# Patient Record
Sex: Female | Born: 1945
Health system: Southern US, Community
[De-identification: ages and names within clinical notes are randomized; demographics above are authoritative.]

## PROBLEM LIST (undated history)

## (undated) DIAGNOSIS — M5136 Other intervertebral disc degeneration, lumbar region: Secondary | ICD-10-CM

## (undated) DIAGNOSIS — J3089 Other allergic rhinitis: Secondary | ICD-10-CM

## (undated) DIAGNOSIS — H1045 Other chronic allergic conjunctivitis: Secondary | ICD-10-CM

## (undated) DIAGNOSIS — N950 Postmenopausal bleeding: Secondary | ICD-10-CM

## (undated) DIAGNOSIS — F329 Major depressive disorder, single episode, unspecified: Secondary | ICD-10-CM

## (undated) DIAGNOSIS — Z87820 Personal history of traumatic brain injury: Secondary | ICD-10-CM

## (undated) DIAGNOSIS — M51369 Other intervertebral disc degeneration, lumbar region without mention of lumbar back pain or lower extremity pain: Secondary | ICD-10-CM

## (undated) DIAGNOSIS — D250 Submucous leiomyoma of uterus: Secondary | ICD-10-CM

## (undated) DIAGNOSIS — Z8742 Personal history of other diseases of the female genital tract: Secondary | ICD-10-CM

## (undated) DIAGNOSIS — F32A Depression, unspecified: Secondary | ICD-10-CM

## (undated) DIAGNOSIS — Z86018 Personal history of other benign neoplasm: Secondary | ICD-10-CM

## (undated) DIAGNOSIS — D649 Anemia, unspecified: Secondary | ICD-10-CM

## (undated) DIAGNOSIS — J302 Other seasonal allergic rhinitis: Secondary | ICD-10-CM

## (undated) DIAGNOSIS — Z9889 Other specified postprocedural states: Secondary | ICD-10-CM

## (undated) DIAGNOSIS — J4540 Moderate persistent asthma, uncomplicated: Secondary | ICD-10-CM

## (undated) DIAGNOSIS — F419 Anxiety disorder, unspecified: Secondary | ICD-10-CM

## (undated) DIAGNOSIS — T7840XA Allergy, unspecified, initial encounter: Secondary | ICD-10-CM

## (undated) HISTORY — DX: Anemia, unspecified: D64.9

## (undated) HISTORY — PX: CERVICAL CERCLAGE: SHX1329

## (undated) HISTORY — DX: Major depressive disorder, single episode, unspecified: F32.9

## (undated) HISTORY — DX: Allergy, unspecified, initial encounter: T78.40XA

## (undated) HISTORY — DX: Postmenopausal bleeding: N95.0

## (undated) HISTORY — DX: Anxiety disorder, unspecified: F41.9

## (undated) HISTORY — DX: Depression, unspecified: F32.A

---

## 1971-08-20 HISTORY — PX: DILATION AND CURETTAGE OF UTERUS: SHX78

## 1979-08-20 HISTORY — PX: TUBAL LIGATION: SHX77

## 1987-08-20 HISTORY — PX: NASAL SINUS SURGERY: SHX719

## 1997-12-28 ENCOUNTER — Other Ambulatory Visit: Admission: RE | Admit: 1997-12-28 | Discharge: 1997-12-28 | Payer: Self-pay | Admitting: Obstetrics and Gynecology

## 1998-04-11 ENCOUNTER — Other Ambulatory Visit: Admission: RE | Admit: 1998-04-11 | Discharge: 1998-04-11 | Payer: Self-pay | Admitting: Obstetrics and Gynecology

## 1999-01-01 ENCOUNTER — Other Ambulatory Visit: Admission: RE | Admit: 1999-01-01 | Discharge: 1999-01-01 | Payer: Self-pay | Admitting: Obstetrics and Gynecology

## 1999-12-31 ENCOUNTER — Ambulatory Visit (HOSPITAL_BASED_OUTPATIENT_CLINIC_OR_DEPARTMENT_OTHER): Admission: RE | Admit: 1999-12-31 | Discharge: 1999-12-31 | Payer: Self-pay | Admitting: Surgery

## 1999-12-31 ENCOUNTER — Encounter (INDEPENDENT_AMBULATORY_CARE_PROVIDER_SITE_OTHER): Payer: Self-pay | Admitting: *Deleted

## 2000-01-31 ENCOUNTER — Other Ambulatory Visit: Admission: RE | Admit: 2000-01-31 | Discharge: 2000-01-31 | Payer: Self-pay | Admitting: Obstetrics and Gynecology

## 2000-03-04 ENCOUNTER — Encounter: Admission: RE | Admit: 2000-03-04 | Discharge: 2000-03-04 | Payer: Self-pay | Admitting: Obstetrics and Gynecology

## 2000-03-04 ENCOUNTER — Encounter: Payer: Self-pay | Admitting: Obstetrics and Gynecology

## 2001-02-02 ENCOUNTER — Other Ambulatory Visit: Admission: RE | Admit: 2001-02-02 | Discharge: 2001-02-02 | Payer: Self-pay | Admitting: Obstetrics and Gynecology

## 2001-03-06 ENCOUNTER — Encounter: Payer: Self-pay | Admitting: Obstetrics and Gynecology

## 2001-03-06 ENCOUNTER — Encounter: Admission: RE | Admit: 2001-03-06 | Discharge: 2001-03-06 | Payer: Self-pay | Admitting: Obstetrics and Gynecology

## 2002-02-16 DIAGNOSIS — N950 Postmenopausal bleeding: Secondary | ICD-10-CM | POA: Insufficient documentation

## 2002-02-16 DIAGNOSIS — N841 Polyp of cervix uteri: Secondary | ICD-10-CM | POA: Insufficient documentation

## 2002-02-26 ENCOUNTER — Other Ambulatory Visit: Admission: RE | Admit: 2002-02-26 | Discharge: 2002-02-26 | Payer: Self-pay | Admitting: Obstetrics and Gynecology

## 2002-03-15 ENCOUNTER — Encounter: Payer: Self-pay | Admitting: Obstetrics and Gynecology

## 2002-03-15 ENCOUNTER — Encounter: Admission: RE | Admit: 2002-03-15 | Discharge: 2002-03-15 | Payer: Self-pay | Admitting: Obstetrics and Gynecology

## 2002-07-05 ENCOUNTER — Other Ambulatory Visit: Admission: RE | Admit: 2002-07-05 | Discharge: 2002-07-05 | Payer: Self-pay | Admitting: Obstetrics and Gynecology

## 2003-03-11 ENCOUNTER — Other Ambulatory Visit: Admission: RE | Admit: 2003-03-11 | Discharge: 2003-03-11 | Payer: Self-pay | Admitting: Obstetrics and Gynecology

## 2003-03-17 ENCOUNTER — Encounter: Payer: Self-pay | Admitting: Obstetrics and Gynecology

## 2003-03-17 ENCOUNTER — Encounter: Admission: RE | Admit: 2003-03-17 | Discharge: 2003-03-17 | Payer: Self-pay | Admitting: Obstetrics and Gynecology

## 2004-03-16 ENCOUNTER — Other Ambulatory Visit: Admission: RE | Admit: 2004-03-16 | Discharge: 2004-03-16 | Payer: Self-pay | Admitting: Obstetrics and Gynecology

## 2004-03-23 ENCOUNTER — Encounter: Admission: RE | Admit: 2004-03-23 | Discharge: 2004-03-23 | Payer: Self-pay | Admitting: Family Medicine

## 2004-05-31 ENCOUNTER — Ambulatory Visit (HOSPITAL_COMMUNITY): Admission: RE | Admit: 2004-05-31 | Discharge: 2004-05-31 | Payer: Self-pay | Admitting: Gastroenterology

## 2005-01-31 ENCOUNTER — Ambulatory Visit (HOSPITAL_BASED_OUTPATIENT_CLINIC_OR_DEPARTMENT_OTHER): Admission: RE | Admit: 2005-01-31 | Discharge: 2005-01-31 | Payer: Self-pay | Admitting: Orthopedic Surgery

## 2005-01-31 ENCOUNTER — Ambulatory Visit (HOSPITAL_COMMUNITY): Admission: RE | Admit: 2005-01-31 | Discharge: 2005-01-31 | Payer: Self-pay | Admitting: Orthopedic Surgery

## 2005-03-22 ENCOUNTER — Other Ambulatory Visit: Admission: RE | Admit: 2005-03-22 | Discharge: 2005-03-22 | Payer: Self-pay | Admitting: Obstetrics and Gynecology

## 2005-04-19 ENCOUNTER — Encounter: Admission: RE | Admit: 2005-04-19 | Discharge: 2005-04-19 | Payer: Self-pay | Admitting: Obstetrics and Gynecology

## 2006-03-25 ENCOUNTER — Other Ambulatory Visit: Admission: RE | Admit: 2006-03-25 | Discharge: 2006-03-25 | Payer: Self-pay | Admitting: Obstetrics and Gynecology

## 2006-04-22 ENCOUNTER — Encounter: Admission: RE | Admit: 2006-04-22 | Discharge: 2006-04-22 | Payer: Self-pay | Admitting: Obstetrics and Gynecology

## 2007-05-18 ENCOUNTER — Encounter: Admission: RE | Admit: 2007-05-18 | Discharge: 2007-05-18 | Payer: Self-pay | Admitting: Family Medicine

## 2007-08-20 HISTORY — PX: BUNIONECTOMY: SHX129

## 2008-05-10 ENCOUNTER — Emergency Department (HOSPITAL_COMMUNITY): Admission: EM | Admit: 2008-05-10 | Discharge: 2008-05-11 | Payer: Self-pay | Admitting: Emergency Medicine

## 2008-05-23 ENCOUNTER — Encounter: Admission: RE | Admit: 2008-05-23 | Discharge: 2008-05-23 | Payer: Self-pay | Admitting: Obstetrics and Gynecology

## 2008-06-03 ENCOUNTER — Emergency Department (HOSPITAL_COMMUNITY): Admission: EM | Admit: 2008-06-03 | Discharge: 2008-06-03 | Payer: Self-pay | Admitting: *Deleted

## 2009-05-25 ENCOUNTER — Encounter: Admission: RE | Admit: 2009-05-25 | Discharge: 2009-05-25 | Payer: Self-pay | Admitting: Obstetrics and Gynecology

## 2010-05-28 ENCOUNTER — Encounter: Admission: RE | Admit: 2010-05-28 | Discharge: 2010-05-28 | Payer: Self-pay | Admitting: Obstetrics and Gynecology

## 2011-01-03 ENCOUNTER — Emergency Department (HOSPITAL_COMMUNITY): Payer: BC Managed Care – PPO

## 2011-01-03 ENCOUNTER — Emergency Department (HOSPITAL_COMMUNITY)
Admission: EM | Admit: 2011-01-03 | Discharge: 2011-01-03 | Disposition: A | Discharge status: Home / Self Care | Payer: BC Managed Care – PPO | Attending: Emergency Medicine | Admitting: Emergency Medicine

## 2011-01-03 DIAGNOSIS — R079 Chest pain, unspecified: Secondary | ICD-10-CM | POA: Insufficient documentation

## 2011-01-03 LAB — POCT CARDIAC MARKERS
CKMB, poc: 1.8 ng/mL (ref 1.0–8.0)
Myoglobin, poc: 124 ng/mL (ref 12–200)

## 2011-01-03 LAB — COMPREHENSIVE METABOLIC PANEL
ALT: 15 U/L (ref 0–35)
AST: 23 U/L (ref 0–37)
Alkaline Phosphatase: 60 U/L (ref 39–117)
BUN: 17 mg/dL (ref 6–23)
Creatinine, Ser: 1.36 mg/dL — ABNORMAL HIGH (ref 0.4–1.2)
GFR calc Af Amer: 48 mL/min — ABNORMAL LOW (ref >60)
GFR calc non Af Amer: 39 mL/min — ABNORMAL LOW (ref >60)
Total Bilirubin: 0.2 mg/dL — ABNORMAL LOW (ref 0.3–1.2)

## 2011-01-03 LAB — CBC
MCV: 82.5 fL (ref 78.0–100.0)
RDW: 13.8 % (ref 11.5–15.5)
WBC: 5.9 10*3/uL (ref 4.0–10.5)

## 2011-01-03 LAB — DIFFERENTIAL
Basophils Absolute: 0 10*3/uL (ref 0.0–0.1)
Basophils Relative: 1 % (ref 0–1)
Lymphocytes Relative: 27 % (ref 12–46)
Lymphs Abs: 1.6 10*3/uL (ref 0.7–4.0)
Monocytes Absolute: 0.5 10*3/uL (ref 0.1–1.0)
Monocytes Relative: 9 % (ref 3–12)
Neutrophils Relative %: 60 % (ref 43–77)

## 2011-01-04 NOTE — Op Note (Signed)
[name], [name] NO.:  [id number]   MEDICAL RECORD NO.:  [id number]          PATIENT TYPE:  AMB   LOCATION:  ENDO                         FACILITY:  [facility]   PHYSICIAN:  Danise Edge, M.D.   DATE OF BIRTH:  1946/07/14   DATE OF PROCEDURE:  05/31/2004  DATE OF DISCHARGE:                                 OPERATIVE REPORT   PROCEDURE:  Colonoscopy.   INDICATIONS:  [name] is a 65 year old female born 1945/09/26.  [name] is scheduled to undergo her first screening colonoscopy with  polypectomy to prevent colon cancer.   ENDOSCOPIST:  Danise Edge, M.D.   PREMEDICATION:  Versed 6 mg, Demerol 60 mg, Narcan 0.5 mg.   PROCEDURE NOTE:  After obtaining informed consent, [name] was placed in  the left lateral decubitus position. I administered intravenous Demerol and  intravenous Versed to achieve conscious sedation for the procedure. The  patient's blood pressure, oxygen saturation, and cardiac rhythm were  monitored throughout the procedure and documented in the medical record.   Anal inspection and digital rectal exam were normal. The Olympus adjustable  pediatric colonoscope was introduced into the rectum and advanced to the  cecum. Colonic preparation for exam today was excellent.   Rectum:  Normal.   Sigmoid colon and descending colon:  Normal.   Splenic flexure:  Normal.   Transverse colon:  Normal.   Hepatic flexure:  Normal.   Ascending colon:  Normal.   Cecum and ileocecal valve:  Normal.   ASSESSMENT:  Normal screening proctocolonoscopy to the cecum.   ADDENDUM:  [name] oxygen saturation fell to the 85% range. It was  most likely the result of equipment malfunction, but because we could not be  sure, she was given Narcan. Prior to giving Narcan, she was conversant and  expressed she was having no difficulty breathing or feeling uncomfortable.      MJ/MEDQ  D:  05/31/2004  T:  05/31/2004  Job:  [id number]

## 2011-01-04 NOTE — Op Note (Signed)
NAMEDESREE, LEAP             ACCOUNT NO.:  [id number]   MEDICAL RECORD NO.:  [id number]          PATIENT TYPE:  AMB   LOCATION:  DSC                          FACILITY:  [facility]   PHYSICIAN:  Nadara Mustard, MD     DATE OF BIRTH:  10/12/1945   DATE OF PROCEDURE:  01/31/2005  DATE OF DISCHARGE:                                 OPERATIVE REPORT   PREOPERATIVE DIAGNOSIS:  Nonunion, right great toe first metatarsal, status  post bunion surgery.   POSTOPERATIVE DIAGNOSIS:  Nonunion, right great toe first metatarsal, status  post bunion surgery.   PROCEDURES:  1.  Take down nonunion.  2.  Removal of deep buried hardware.  3.  Revision open reduction, internal fixation, right first metatarsal.   SURGEON:  Nadara Mustard, MD   ANESTHESIA:  General.   ESTIMATED BLOOD LOSS:  Minimal.   ANTIBIOTICS:  Clindamycin 600 mg IV.   TOURNIQUET TIME:  Esmarch at the ankle for approximately 30 minutes.   DISPOSITION:  To PACU in stable condition.   INDICATION FOR PROCEDURE:  The patient is a 65 year old woman who is status  post failed bunion procedure to her right great toe.  She has developed a nonunion at the osteotomy site and presents at this time  for the above-mentioned surgery.  The risks and benefits were discussed  including infection, neurovascular injury, persistent nonunion fracture of  the bone, need for additional surgery, nonhealing of the wound.  The patient  states she understands and wishes to proceed at this time.   DESCRIPTION OF PROCEDURE:  The patient was brought to OR room six and  underwent a general anesthetic.  After adequate level of anesthesia  obtained, the patient's right lower extremity was prepped using DuraPrep and  draped into a sterile field.  The previous dorsal incision was used.  This  was carried down to the first metatarsal.  With subperiosteal dissection,  the nonunion site was identified.  The nonunion osteotomy site was mobile  with fibrinous  tissue ingrown within the osteotomy site.  The deep retained  screw was removed.  This appeared to be a 2-mm screw.  The nonunion site was taken down, all  fibrinous tissue was removed.  The wound was irrigated with normal saline.  There was good bleeding petechial bone along the bone edges.  The first  metatarsal was reduced and using a lag screw technique, two 3.5 cortical  screws were then used to stabilize the nonunion site.  Again the wound was  irrigated.  The Esmarch was released after approximately 30 minutes.  Hemostasis was obtained.  The incision was closed using 3-0 nylon with a far-  near, near-far suture.  The wound was covered with Adaptic orthopedic  sponges, Webril and a Coban.  The patient was extubated, taken to PACU in  stable condition.  Plan for discharge to home, nonweightbearing and  elevation, follow-up in the office in 2 weeks.       MVD/MEDQ  D:  01/31/2005  T:  01/31/2005  Job:  [id number]

## 2011-01-11 ENCOUNTER — Encounter: Payer: Self-pay | Admitting: Cardiovascular Disease

## 2011-01-16 ENCOUNTER — Ambulatory Visit (INDEPENDENT_AMBULATORY_CARE_PROVIDER_SITE_OTHER): Payer: BC Managed Care – PPO | Admitting: Cardiovascular Disease

## 2011-01-16 ENCOUNTER — Encounter: Payer: Self-pay | Admitting: Cardiovascular Disease

## 2011-01-16 DIAGNOSIS — R079 Chest pain, unspecified: Secondary | ICD-10-CM | POA: Insufficient documentation

## 2011-01-16 DIAGNOSIS — J45909 Unspecified asthma, uncomplicated: Secondary | ICD-10-CM

## 2011-01-16 NOTE — Assessment & Plan Note (Signed)
Atypical.  ER R/O.  F/U stress echo

## 2011-01-16 NOTE — Patient Instructions (Addendum)
Your physician recommends that you schedule a follow-up appointment in: AS NEEDED  You have been referred to PULMONARY FOR ASTHMA  Your physician recommends that you continue on your current medications as directed. Please refer to the Current Medication list given to you today.

## 2011-01-16 NOTE — Assessment & Plan Note (Signed)
Likely bronchospastic disease with allergies and atypical viral flares.  F/U with our pulmonary department at patient request.  No wheezing on exam.  Recommended claritin and mucinex

## 2011-01-16 NOTE — Progress Notes (Signed)
Seen in ER by Dr Leo Rod 5/17 for atypical chest pain.  Normal ECG, CXR with hyperaeration.  And normal labs.  Has had a lot of issues for the last 6 months with URI, asthma and dyspnea.  Has been on multiple antibiotics and 3 steroid tapers.  Dx with asthma a long time ago by Dr Idell Pickles.  Advair seems to make her worse.  Wants to be seen by pulmonary which I think is reasonable.  Last week had congestion and dizzyness that sounded like vertigo. Passed after 3 days.  Throughout all this has had an aching in her chest and left diaphragm area.  Pain can be positional.  Associated with dyspnea.  Not exertional.  Can work in the garden and not get pain until she is done.  Very atypical pain.  Has LE leg pain and does not think she can walk on a treadmill.  With history of asthma will do dobutamine echo  ROS: Denies fever, malais, weight loss, blurry vision, decreased visual acuity, cough, sputum, SOB, hemoptysis, pleuritic pain, palpitaitons, heartburn, abdominal pain, melena, lower extremity edema, claudication, or rash.   General: Affect appropriate Healthy:  appears stated age HEENT: normal Neck supple with no adenopathy JVP normal no bruits no thyromegaly Lungs clear with no wheezing and good diaphragmatic motion Heart:  S1/S2 no murmur,rub, gallop or click PMI normal Abdomen: benighn, BS positve, no tenderness, no AAA no bruit.  No HSM or HJR Distal pulses intact with no bruits No edema Neuro non-focal Skin warm and dry No muscular weakness  Medications Current Outpatient Prescriptions  Medication Sig Dispense Refill  . ALBUTEROL SULFATE HFA IN Inhale into the lungs as directed.        . Cholecalciferol (VITAMIN D) 2000 UNITS CAPS 1 tab po qd       . Fluticasone-Salmeterol (ADVAIR DISKUS) 250-50 MCG/DOSE AEPB Inhale 1 puff into the lungs every 12 (twelve) hours.        Marland Kitchen DISCONTD: Fluticasone-Salmeterol (ADVAIR DISKUS IN) Inhale into the lungs as directed.           Allergies Amoxicillin; Erythromycin; Keflex; Penicillins; and Sulfa drugs cross reactors  Family History: No family history on file.  Social History: History   Social History  . Marital Status: Married    Spouse Name: N/A    Number of Children: N/A  . Years of Education: N/A   Occupational History  . Not on file.   Social History Main Topics  . Smoking status: Never Smoker   . Smokeless tobacco: Not on file  . Alcohol Use: Not on file  . Drug Use: Not on file  . Sexually Active: Not on file   Other Topics Concern  . Not on file   Social History Narrative  . No narrative on file    Electrocardiogram:    5/17  NSR normal ECG in ER  Assessment and Plan

## 2011-01-28 ENCOUNTER — Encounter: Payer: Self-pay | Admitting: Internal Medicine

## 2011-01-30 ENCOUNTER — Ambulatory Visit (INDEPENDENT_AMBULATORY_CARE_PROVIDER_SITE_OTHER): Payer: BC Managed Care – PPO | Admitting: Internal Medicine

## 2011-01-30 ENCOUNTER — Encounter: Payer: Self-pay | Admitting: Internal Medicine

## 2011-01-30 ENCOUNTER — Ambulatory Visit (HOSPITAL_COMMUNITY)
Admission: RE | Admit: 2011-01-30 | Discharge: 2011-01-30 | Disposition: A | Discharge status: Home / Self Care | Payer: BC Managed Care – PPO | Source: Ambulatory Visit | Attending: Internal Medicine | Admitting: Internal Medicine

## 2011-01-30 VITALS — BP 116/76 | HR 57 | Temp 97.7°F | Ht 65.0 in | Wt 148.6 lb

## 2011-01-30 DIAGNOSIS — J45909 Unspecified asthma, uncomplicated: Secondary | ICD-10-CM | POA: Insufficient documentation

## 2011-01-30 DIAGNOSIS — R079 Chest pain, unspecified: Secondary | ICD-10-CM

## 2011-01-30 LAB — PULMONARY FUNCTION TEST

## 2011-01-30 NOTE — Patient Instructions (Addendum)
#  ASthma symptoms We need to confirm suspicion of asthma and severity Please have breathing test called FULL PFT  - we can try to have them asap [place] [facility] [place] or [place] Once done have them fax me result Based on result, I will have you come back or have you do methacholine challenge test - irritant provocation test for asthma #Chest ache  - based on asthma workup   - follow with PMD

## 2011-01-30 NOTE — Assessment & Plan Note (Deleted)
#  ASthma symptoms We need to confirm suspicion of asthma and severity (she agrees and prefers a plan of confirmation of asthma as opposed to clinical suspicion) Please have breathing test called FULL PFT  - we can try to have them asap at [facility] [place] or [place] Once done have them fax me result Based on result, I will have you come back or have you do methacholine challenge test - irritant provocation test for asthma   

## 2011-01-30 NOTE — Progress Notes (Signed)
Subjective:    Patient ID: Catherine Chambers, female    DOB: 1946/04/08, 65 y.o.   MRN: [id number]  HPI 65 year old AA female. Known asthmatic.   Asthma - diagnosed late 64s. Was on medications for 1 year. States changed diet and lifestyle and after that "asthma went away" and she "beat it". Late 2010 got URI, and had asthma flare. Since then 2 "major" asthma flares needing prednisone. Most recently since mid-dec 2011 asthma worsened and started advair through self direction. Because normally advair would cause tachycardia and paradoxical chest tightness so she was taking it only prn. End April 2012, had worsening shortness of breath with minimal exertion (very different from usual asthma symptoms that are milder). Reportedly Dr. Renae Gloss PMD did CXR and this was normal. Was advised advair but at lower dose 100/50. She took it for 1 week but got worse and went back 12/24/2010; given 6 day prednisone, 7day probably levaquin. By 12/30/2010 feeling better/great (mucus, yellow stuff, chest congestion, dyspnea, chest tightness) and so she stopped advair.   Since then was doing well in asthma terms but Went to ER at [facility] on 01/03/2011 due to ache in left precordial area (she states this is different from asthma symptoms but at same time also goes together, present since 11/18/2010,  Worsened by certain positions, dull sensation, does not want to call it a pain but calls it a ache, prednisone taper helped a bit, does weight lifting 4 times per week which she has eased up now since pain started but after 12/30/2010 has started again - 10-13# only). States that EKG, CXR normal in ER, labs normal except creat 1.36 (1.222 some years ago), troponin normal (all reivewed) and discharged with advise to follow with pulmonary. Cardiac stress test is currently pending (due to ankle sprain from working in garden 1 month ago)  Other hx: sinus allergies due to dust, pollen, grass, birds, cats, dogs. Took 5 years of allergy shorts in  early 1990s; helped a lot. No birds, cats, dogs at home. 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 No cig smoke exposure except as a child her dad smoked. No family hx of asthma.  Does 15 miles on stationary bike despite symptoms.  Personality: adverse to medication as much as possible. Prefers natural methods of treatment like dietary changes and exercise. Prefers proof of diagnosis. Wishes to carefull weigh benefits and risks before Rx decisions   Review of Systems  Constitutional: Positive for unexpected weight change. Negative for fever.  HENT: Positive for ear pain, congestion and sneezing. Negative for nosebleeds, sore throat, rhinorrhea, trouble swallowing, dental problem, postnasal drip and sinus pressure.   Eyes: Negative for redness and itching.  Respiratory: Positive for cough, chest tightness and shortness of breath. Negative for wheezing.   Cardiovascular: Positive for palpitations. Negative for leg swelling.  Gastrointestinal: Negative for nausea and vomiting.  Genitourinary: Negative for dysuria.  Musculoskeletal: Negative for joint swelling.  Skin: Negative for rash.  Neurological: Negative for headaches.  Hematological: Does not bruise/bleed easily.  Psychiatric/Behavioral: Negative for dysphoric mood. The patient is not nervous/anxious.        Objective:   Physical Exam  Vitals reviewed. Constitutional: She is oriented to person, place, and time. She appears well-developed and well-nourished. No distress.  HENT:  Head: Normocephalic and atraumatic.  Right Ear: External ear normal.  Left Ear: External ear normal.  Mouth/Throat: Oropharynx is clear and moist. No oropharyngeal exudate.  Eyes: Conjunctivae and EOM are normal. Pupils are equal, round, and reactive to light. Right eye exhibits no discharge. Left eye exhibits no discharge. No scleral icterus.  Neck: Normal range of motion. Neck supple. No JVD present. No tracheal  deviation present. No thyromegaly present.  Cardiovascular: Normal rate, regular rhythm, normal heart sounds and intact distal pulses.  Exam reveals no gallop and no friction rub.   No murmur heard. Pulmonary/Chest: Effort normal and breath sounds normal. No respiratory distress. She has no wheezes. She has no rales. She exhibits no tenderness.       No chest wall tenderness  Abdominal: Soft. Bowel sounds are normal. She exhibits no distension and no mass. There is no tenderness. There is no rebound and no guarding.  Musculoskeletal: Normal range of motion. She exhibits no edema and no tenderness.  Lymphadenopathy:    She has no cervical adenopathy.  Neurological: She is alert and oriented to person, place, and time. She has normal reflexes. No cranial nerve deficit. She exhibits normal muscle tone. Coordination normal.  Skin: Skin is warm and dry. No rash noted. She is not diaphoretic. No erythema. No pallor.  Psychiatric: She has a normal mood and affect. Her behavior is normal. Judgment and thought content normal.          Assessment & Plan:

## 2011-01-30 NOTE — Assessment & Plan Note (Signed)
#  ASthma symptoms We need to confirm suspicion of asthma and severity (she agrees and prefers a plan of confirmation of asthma as opposed to clinical suspicion) Please have breathing test called FULL PFT  - we can [facility] to have them [facility]ap [facility]  or [place] Once done have them fax me result Based on result, I will have you come back or have you do methacholine challenge test - irritant provocation test for asthma

## 2011-01-30 NOTE — Assessment & Plan Note (Addendum)
#  Chest ache  - based on asthma workup   - follow with PMD  - do cardiac strss test based on PMD advice

## 2011-02-04 ENCOUNTER — Telehealth: Payer: Self-pay | Admitting: Internal Medicine

## 2011-02-04 NOTE — Telephone Encounter (Signed)
Please advise results of PFT done at [facility] on 01/30/11. Thanks!

## 2011-02-06 NOTE — Telephone Encounter (Signed)
Called spoke with patient, apologized for delay in getting her her PFT results.  MR in office this afternoon - please advise, thanks!!!!!  Also, pt c/o increased SOB, wheezing, prod cough with thick and "ropy" mucus though she does not look at it for a color x4days, worse x2 days.  Reports using her rescue every 6-7 hours.  Denies f/c/s.  Please advise, thanks.  Allergies  Allergen Reactions  . Amoxicillin   . Erythromycin   . Keflex   . Penicillins   . Sulfa Drugs Cross Reactors     [facility].

## 2011-02-06 NOTE — Telephone Encounter (Signed)
PFTs 01/30/2011 - shows moderate persistent asthma Fev1 1.33/66%, BD response 36%, DLCO 83%, ration 47  Discussed in detail, options etc  Please start 1. symbicort 160/4.5 2 puff bid  - give 2-3 samples, teach techniqure, she will be in 02/07/2011 to pick up 2. Albuterol 2 puff prn - give 1 sample 3. ROV 4-6 weeks with spirometry

## 2011-02-06 NOTE — Telephone Encounter (Signed)
PT WANTS RESULTS OF PFT- ALSO SAYS SHE IS COUGHING W/ MUCUS- SOB/ USING INHALER "OFTEN:. RECS [facility] [place]. CALL PT AT [phone]. Catherine Chambers

## 2011-02-07 NOTE — Telephone Encounter (Signed)
Pt came to office and given samples of symbicort and ventolin and shown technique. Also appt set.Carron Curie, CMA

## 2011-02-15 ENCOUNTER — Encounter: Payer: Self-pay | Admitting: Internal Medicine

## 2011-03-19 ENCOUNTER — Encounter: Payer: Self-pay | Admitting: Internal Medicine

## 2011-03-19 ENCOUNTER — Ambulatory Visit (INDEPENDENT_AMBULATORY_CARE_PROVIDER_SITE_OTHER): Payer: BC Managed Care – PPO | Admitting: Internal Medicine

## 2011-03-19 VITALS — BP 110/60 | HR 60 | Temp 97.7°F | Ht 65.5 in | Wt 146.6 lb

## 2011-03-19 DIAGNOSIS — J45909 Unspecified asthma, uncomplicated: Secondary | ICD-10-CM

## 2011-03-19 MED ORDER — BUDESONIDE-FORMOTEROL FUMARATE 160-4.5 MCG/ACT IN AERO: 2.0000 | INHALATION_SPRAY | Freq: Two times a day (BID) | RESPIRATORY_TRACT | Status: DC

## 2011-03-19 NOTE — Patient Instructions (Signed)
You are asthma is much improved Continue symbicort 160/4.5 2 puff twice daily Continue albuterol as needed -if you are resorting to more than 2 times per week of albuterol it means your asthma is getting worse; call us then AT followup we will discuss allergy shots, lowering dose or change to just inhaled steroids alone We discussed asthma resarch study called AUSTRI and we agreed that you wont participate in it Return in 6-8 months or sooner if needed

## 2011-03-19 NOTE — Progress Notes (Signed)
Subjective:    Patient ID: Catherine Chambers, female    DOB: [birth year], 65 y.o.   MRN: [id number]  HPI  Folllowup asthma with strong allergy hx. Hx of poor tolerance to advair (tachycardia)  OV 03/18/2011: Last visit mid June 2012. Fev1 was 1.33L/66%, ratio 47. Started symbicort. STates she did really well with same. No tachycardia. Symptoms of dyspnea, chest ache and chest tightness all resolved. Albuterol use dropped to zero. She is pleased. She also picked up URI from grandson 2 weeks ago and developed cough but was pleasantly surprised that during this time there was no dyspnea, wheeze or aluterol use; feels symbicort helped handle URI much better than before. Currently some mild residual improving dry cough that is sporadic wihtout clear cut aggravating or relieving factors. Denies associated dyspnea, sinus drainage, GERD  Spirometry today shows normalization to 1.89L/83% of Fev1  Past Medical History  Diagnosis Date  . Chest pain   . Asthma   . Anxiety   . Depression      Family History  Problem Relation Age of Onset  . Heart disease Father   . Cancer Mother     abdominal     History   Social History  . Marital Status: Married    Spouse Name: N/A    Number of Children: 2  . Years of Education: N/A   Occupational History  . retired    Social History Main Topics  . Smoking status: Never Smoker   . Smokeless tobacco: Not on file  . Alcohol Use: No  . Drug Use: No  . Sexually Active: Not on file   Other Topics Concern  . Not on file   Social History Narrative  . No narrative on file     Allergies  Allergen Reactions  . Amoxicillin   . Erythromycin   . Keflex   . Penicillins   . Sulfa Drugs Cross Reactors      Outpatient Prescriptions Prior to Visit  Medication Sig Dispense Refill  . ALBUTEROL SULFATE HFA IN Inhale into the lungs as directed.        . Cholecalciferol (VITAMIN D) 2000 UNITS CAPS 1 tab po qd       . Fluticasone-Salmeterol (ADVAIR  DISKUS) 250-50 MCG/DOSE AEPB Inhale 1 puff into the lungs every 12 (twelve) hours.           Review of Systems  Constitutional: Negative for fever, diaphoresis and unexpected weight change.  HENT: Negative for ear pain, nosebleeds, congestion, sore throat, rhinorrhea, sneezing, trouble swallowing, dental problem, postnasal drip and sinus pressure.   Eyes: Negative for redness and itching.  Respiratory: Negative for cough, chest tightness, shortness of breath and wheezing.   Cardiovascular: Negative for palpitations and leg swelling.  Gastrointestinal: Negative for nausea and vomiting.  Genitourinary: Negative for dysuria.  Musculoskeletal: Negative for joint swelling.  Skin: Negative for rash.  Neurological: Negative for headaches.  Hematological: Does not bruise/bleed easily.  Psychiatric/Behavioral: Negative for dysphoric mood. The patient is not nervous/anxious.        Objective:   Physical Exam  Vitals reviewed. Constitutional: She is oriented to person, place, and time. She appears well-developed and well-nourished. No distress.       Coughs occ during exam  HENT:  Head: Normocephalic and atraumatic.  Right Ear: External ear normal.  Left Ear: External ear normal.  Mouth/Throat: Oropharynx is clear and moist. No oropharyngeal exudate.  Eyes: Conjunctivae and EOM are normal. Pupils are equal, round, and reactive to  light. Right eye exhibits no discharge. Left eye exhibits no discharge. No scleral icterus.  Neck: Normal range of motion. Neck supple. No JVD present. No tracheal deviation present. No thyromegaly present.  Cardiovascular: Normal rate, regular rhythm, normal heart sounds and intact distal pulses.  Exam reveals no gallop and no friction rub.   No murmur heard. Pulmonary/Chest: Effort normal and breath sounds normal. No respiratory distress. She has no wheezes. She has no rales. She exhibits no tenderness.  Abdominal: Soft. Bowel sounds are normal. She exhibits no  distension and no mass. There is no tenderness. There is no rebound and no guarding.  Musculoskeletal: Normal range of motion. She exhibits no edema and no tenderness.  Lymphadenopathy:    She has no cervical adenopathy.  Neurological: She is alert and oriented to person, place, and time. She has normal reflexes. No cranial nerve deficit. She exhibits normal muscle tone. Coordination normal.  Skin: Skin is warm and dry. No rash noted. She is not diaphoretic. No erythema. No pallor.  Psychiatric: She has a normal mood and affect. Her behavior is normal. Judgment and thought content normal.          Assessment & Plan:   No problem-specific assessment & plan notes found for this encounter.

## 2011-03-19 NOTE — Assessment & Plan Note (Signed)
Fev1 improved from 1.33L/66% mid June 2012 to 1.89L/89% today with starting symbicort. Albuterol use is none. Handled URI 2 weeks ago relatively well but still with residual but resolving cough from that  PLAN You are asthma is much improved Continue symbicort 160/4.5 2 puff twice daily Continue albuterol as needed -if you are resorting to more than 2 times per week of albuterol it means your asthma is getting worse; call us then AT followup we will discuss allergy shots, lowering dose or change to just inhaled steroids alone We discussed asthma resarch study called AUSTRI and we agreed that you wont participate in it Return in 6-8 months or sooner if needed

## 2011-04-23 ENCOUNTER — Other Ambulatory Visit: Payer: Self-pay | Admitting: Internal Medicine

## 2011-04-23 DIAGNOSIS — Z1231 Encounter for screening mammogram for malignant neoplasm of breast: Secondary | ICD-10-CM

## 2011-05-20 LAB — CBC
HCT: 39.9
Hemoglobin: 12.9
MCV: 84.8
Platelets: 340
RBC: 4.7
WBC: 6.4

## 2011-05-20 LAB — URINALYSIS, ROUTINE W REFLEX MICROSCOPIC
Specific Gravity, Urine: 1.023
pH: 6

## 2011-05-20 LAB — DIFFERENTIAL
Basophils Relative: 0
Lymphs Abs: 0.7
Monocytes Absolute: 0.1
Monocytes Relative: 2 — ABNORMAL LOW
Neutro Abs: 5.7

## 2011-05-20 LAB — BASIC METABOLIC PANEL
CO2: 25
Chloride: 105
GFR calc Af Amer: 54 — ABNORMAL LOW
GFR calc non Af Amer: 45 — ABNORMAL LOW
Potassium: 4.4
Sodium: 138

## 2011-06-03 ENCOUNTER — Ambulatory Visit
Admission: RE | Admit: 2011-06-03 | Discharge: 2011-06-03 | Disposition: A | Discharge status: Home / Self Care | Payer: BC Managed Care – PPO | Source: Ambulatory Visit | Attending: Internal Medicine | Admitting: Internal Medicine

## 2011-06-03 DIAGNOSIS — Z1231 Encounter for screening mammogram for malignant neoplasm of breast: Secondary | ICD-10-CM

## 2011-10-17 ENCOUNTER — Encounter: Payer: Self-pay | Admitting: Internal Medicine

## 2011-10-17 ENCOUNTER — Ambulatory Visit (INDEPENDENT_AMBULATORY_CARE_PROVIDER_SITE_OTHER): Payer: BC Managed Care – PPO | Admitting: Internal Medicine

## 2011-10-17 VITALS — BP 108/76 | HR 57 | Temp 98.1°F | Ht 65.0 in | Wt 150.0 lb

## 2011-10-17 DIAGNOSIS — J45909 Unspecified asthma, uncomplicated: Secondary | ICD-10-CM

## 2011-10-17 MED ORDER — BUDESONIDE-FORMOTEROL FUMARATE 160-4.5 MCG/ACT IN AERO: 1.0000 | INHALATION_SPRAY | Freq: Two times a day (BID) | RESPIRATORY_TRACT | Status: DC

## 2011-10-17 NOTE — Patient Instructions (Signed)
You are asthma is very well controlled clinically Respect your decision not to take flu shot or pneumonia vaccine Continue symbicort 160/4.5 but okay to reduce it to 1 puff twice daily at your request - nurse will ensure refills Continue albuterol as needed -if you are resorting to more than 2 times per week of albuterol it means your asthma is getting worse; call us then At your request we will refer you to Dr Meg Whalen or one of her associates at the [facility] in [place], [place] Return in 6-8 months or sooner if needed with office spirometry at followup   

## 2011-10-17 NOTE — Assessment & Plan Note (Signed)
You are asthma is very well controlled clinically Respect your decision not to take flu shot or pneumonia vaccine Continue symbicort 160/4.5 but okay to reduce it to 1 puff twice daily at your request - nurse will ensure refills Continue albuterol as needed -if you are resorting to more than 2 times per week of albuterol it means your asthma is getting worse; call us then At your request we will refer you to Dr Aris Georgia or one of her associates at the [facility] in [place], [place] Return in 6-8 months or sooner if needed with office spirometry at followup

## 2011-10-17 NOTE — Progress Notes (Signed)
Subjective:    Patient ID: Catherine Chambers, female    DOB: Mar 07, 1946, 66 y.o.   MRN: [id number]  HPI [place] asthma with strong allergy hx. Hx of poor tolerance to advair (tachycardia)  OV 03/18/2011: Last visit mid June 2012. Fev1 was 1.33L/66%, ratio 47. Started symbicort. STates she did really well with same. No tachycardia. Symptoms of dyspnea, chest ache and chest tightness all resolved. Albuterol use dropped to zero. She is pleased. She also picked up URI from grandson 2 weeks ago and developed cough but was pleasantly surprised that during this time there was no dyspnea, wheeze or aluterol use; feels symbicort helped handle URI much better than before. Currently some mild residual improving dry cough that is sporadic wihtout clear cut aggravating or relieving factors. Denies associated dyspnea, sinus drainage, GERD  Spirometry today shows normalization to 1.89L/83% of Fev1   REC You are asthma is much improved Continue symbicort 160/4.5 2 puff twice daily Continue albuterol as needed -if you are resorting to more than 2 times per week of albuterol it means your asthma is getting worse; call us then AT followup we will discuss allergy shots, lowering dose or change to just inhaled steroids alone We discussed asthma resarch study called AUSTRI and we agreed that you wont participate in it Return in 6-8 months or sooner if needed    OV 10/17/2011  Followup moderate persistent asthma. Last visit was 7 months ago on March 18, 2011. Since last visit very compliant with symbicort 160/4.5 2 puff bid. No albuterol use in interim. In winter 2012 had flu like illnes and out of work for 1 week but no ae-asthma. No nocutrnal awakenings due to asthma. Wants to see allergist in [place], [place] - specifically asking for [facility] there to help control allergies. Very keen on trialling symbicort 160 at 1 puff twice daily. Spirometry not done today. Asymptomatic overall  Past, Family, Social  reviewed: no change since last visit other than in HPI   Review of Systems  Constitutional: Negative for fever and unexpected weight change.  HENT: Negative for ear pain, nosebleeds, congestion, sore throat, rhinorrhea, sneezing, trouble swallowing, dental problem and postnasal drip.   Eyes: Positive for itching. Negative for redness.  Respiratory: Negative for cough, chest tightness, shortness of breath and wheezing.   Cardiovascular: Negative for palpitations and leg swelling.  Gastrointestinal: Negative for nausea, vomiting and diarrhea.  Genitourinary: Negative for dysuria.  Musculoskeletal: Negative for joint swelling.  Skin: Negative for rash.  Neurological: Negative for headaches.  Psychiatric/Behavioral: Negative for dysphoric mood. The patient is not nervous/anxious.       C Objective:   Physical Exam Physical Exam  Vitals reviewed. Constitutional: She is oriented to person, place, and time. She appears well-developed and well-nourished. No distress.   HENT:  Head: Normocephalic and atraumatic.  Right Ear: External ear normal.  Left Ear: External ear normal.  Mouth/Throat: Oropharynx is clear and moist. No oropharyngeal exudate.  Eyes: Conjunctivae and EOM are normal. Pupils are equal, round, and reactive to light. Right eye exhibits no discharge. Left eye exhibits no discharge. No scleral icterus.  Neck: Normal range of motion. Neck supple. No JVD present. No tracheal deviation present. No thyromegaly present.  Cardiovascular: Normal rate, regular rhythm, normal heart sounds and intact distal pulses.  Exam reveals no gallop and no friction rub.   No murmur heard. Pulmonary/Chest: Effort normal and breath sounds normal. No respiratory distress. She has no wheezes. She has no rales. She exhibits no  tenderness.  Abdominal: Soft. Bowel sounds are normal. She exhibits no distension and no mass. There is no tenderness. There is no rebound and no guarding.  Musculoskeletal:  Normal range of motion. She exhibits no edema and no tenderness.  Lymphadenopathy:    She has no cervical adenopathy.  Neurological: She is alert and oriented to person, place, and time. She has normal reflexes. No cranial nerve deficit. She exhibits normal muscle tone. Coordination normal.  Skin: Skin is warm and dry. No rash noted. She is not diaphoretic. No erythema. No pallor.  Psychiatric: She has a normal mood and affect. Her behavior is normal. Judgment and thought content normal.         Assessment & Plan:

## 2011-10-18 ENCOUNTER — Encounter: Payer: Self-pay | Admitting: Internal Medicine

## 2012-04-10 ENCOUNTER — Ambulatory Visit (INDEPENDENT_AMBULATORY_CARE_PROVIDER_SITE_OTHER): Payer: Medicare Other | Admitting: Internal Medicine

## 2012-04-10 ENCOUNTER — Encounter: Payer: Self-pay | Admitting: Internal Medicine

## 2012-04-10 VITALS — BP 112/72 | HR 65 | Ht 65.0 in | Wt 161.4 lb

## 2012-04-10 DIAGNOSIS — J45909 Unspecified asthma, uncomplicated: Secondary | ICD-10-CM

## 2012-04-10 NOTE — Assessment & Plan Note (Signed)
#  Asthma   We have agreed that you need some amount of inhaler steroid to control symptoms but we want to balance this with your desire to be on as low inhaled steroids as possible Therefore, for now go back on symbicort 160/4.5 - take it 1 puff twice daily for 4 months (because you have this supply with you), then go to 80/4.5 1 puff twice daily  No flu shot or pneumovax per your request  Return to see me in 6 months Spirometry at followup Please take the handout on side efects of inhaled steroid which we discussed

## 2012-04-10 NOTE — Patient Instructions (Addendum)
#  Asthma   We have agreed that you need some amount of inhaler steroid to control symptoms but we want to balance this with your desire to be on as low inhaled steroids as possible Therefore, for now go back on symbicort 160/4.5 - take it 1 puff twice daily for 4 months (because you have this supply with you), then go to 80/4.5 1 puff twice daily  No flu shot or pneumovax per your request  Return to see me in 6 months Spirometry at followup Please take the handout on side efects of inhaled steroid which we discussed  

## 2012-04-10 NOTE — Progress Notes (Signed)
Subjective:    Patient ID: Catherine Chambers, female    DOB: 1946/03/24, 66 y.o.   MRN: [id number]  HPI [place] moderate persistent asthma with strong allergy hx. Hx of poor tolerance to advair (tachycardia)  OV 03/18/2011: Last visit mid June 2012. Fev1 was 1.33L/66%, ratio 47. Started symbicort. STates she did really well with same. No tachycardia. Symptoms of dyspnea, chest ache and chest tightness all resolved. Albuterol use dropped to zero. She is pleased. She also picked up URI from grandson 2 weeks ago and developed cough but was pleasantly surprised that during this time there was no dyspnea, wheeze or aluterol use; feels symbicort helped handle URI much better than before. Currently some mild residual improving dry cough that is sporadic wihtout clear cut aggravating or relieving factors. Denies associated dyspnea, sinus drainage, GERD  Spirometry today shows normalization to 1.89L/83% of Fev1   REC You are asthma is much improved Continue symbicort 160/4.5 2 puff twice daily Continue albuterol as needed -if you are resorting to more than 2 times per week of albuterol it means your asthma is getting worse; call us then AT followup we will discuss allergy shots, lowering dose or change to just inhaled steroids alone We discussed asthma resarch study called AUSTRI and we agreed that you wont participate in it Return in 6-8 months or sooner if needed    OV 10/17/2011  Followup moderate persistent asthma. Last visit was 7 months ago on March 18, 2011. Since last visit very compliant with symbicort 160/4.5 2 puff bid. No albuterol use in interim. In winter 2012 had flu like illnes and out of work for 1 week but no ae-asthma. No nocutrnal awakenings due to asthma. Wants to see allergist in [place], [place] - specifically asking for [facility] there to help control allergies. Very keen on trialling symbicort 160 at 1 puff twice daily. Spirometry not done today. Asymptomatic overall  Past,  Family, Social reviewed: no change since last visit other than in HPI  REC You are asthma is very well controlled clinically  Respect your decision not to take flu shot or pneumonia vaccine  Continue symbicort 160/4.5 but okay to reduce it to 1 puff twice daily at your request - nurse will ensure refills  Continue albuterol as needed -if you are resorting to more than 2 times per week of albuterol it means your asthma is getting worse; call us then  At your request we will refer you to Dr Aris Georgia or one of her associates at the [facility] in [place], [place]  Return in 6-8 months or sooner if needed with office spirometry at followup   OV 04/10/2012 [place] moderate persistent asthma with strong allergy hx. Since last visit 6 months ago in feb 2013, has seen Dr Aris Georgia per hx. Skin tests positive per h.  Now on allergy shots which she is not sure is helping or not. In terms of asthma, she has slowly weaned herself off symbicort and off it for 1 month. This is to see if how well she can naturally adapt to disease but past week she is noticing some chest tightness at rest in the evenings. This is mild. Unclear aggravating or relieving factors. No asccoiated cough, wheeze, nocturnal awakenings other than baseline constant state of sinus fulness.   Spirometry today fev1 1.65/80%, ratio 56 (best was 1.89L in feb 2013 on symbicort)  She is very concerned about side effects of inhaled steroids long term but does feel she needs  some inhaled steroids to help symptoms. Also concerned about cost issues.   She does not want flu shot or pneumovax    Review of Systems  Constitutional: Negative for fever and unexpected weight change.  HENT: Negative for ear pain, nosebleeds, congestion, sore throat, rhinorrhea, sneezing, trouble swallowing, dental problem, postnasal drip and sinus pressure.   Eyes: Negative for redness and itching.  Respiratory: Negative for cough, chest tightness,  shortness of breath and wheezing.   Cardiovascular: Negative for palpitations and leg swelling.  Gastrointestinal: Negative for nausea and vomiting.  Genitourinary: Negative for dysuria.  Musculoskeletal: Negative for joint swelling.  Skin: Negative for rash.  Neurological: Negative for headaches.  Hematological: Does not bruise/bleed easily.  Psychiatric/Behavioral: Negative for dysphoric mood. The patient is not nervous/anxious.        Objective:   Physical Exam Vitals reviewed. Constitutional: She is oriented to person, place, and time. She appears well-developed and well-nourished. No distress.   HENT:  Head: Normocephalic and atraumatic.  Right Ear: External ear normal. EAR DRUM NORMAL Left Ear: WAX  + Mouth/Throat: Oropharynx is clear and moist. No oropharyngeal exudate.  Eyes: Conjunctivae and EOM are normal. Pupils are equal, round, and reactive to light. Right eye exhibits no discharge. Left eye exhibits no discharge. No scleral icterus.  Neck: Normal range of motion. Neck supple. No JVD present. No tracheal deviation present. No thyromegaly present.  Cardiovascular: Normal rate, regular rhythm, normal heart sounds and intact distal pulses.  Exam reveals no gallop and no friction rub.   No murmur heard. Pulmonary/Chest: Effort normal and breath sounds normal. No respiratory distress. She has no wheezes. She has no rales. She exhibits no tenderness.  Abdominal: Soft. Bowel sounds are normal. She exhibits no distension and no mass. There is no tenderness. There is no rebound and no guarding.  Musculoskeletal: Normal range of motion. She exhibits no edema and no tenderness.  Lymphadenopathy:    She has no cervical adenopathy.  Neurological: She is alert and oriented to person, place, and time. She has normal reflexes. No cranial nerve deficit. She exhibits normal muscle tone. Coordination normal.  Skin: Skin is warm and dry. No rash noted. She is not diaphoretic. No erythema. No  pallor.  Psychiatric: She has a normal mood and affect. Her behavior is normal. Judgment and thought content normal.           Assessment & Plan:

## 2012-04-16 ENCOUNTER — Ambulatory Visit: Payer: BC Managed Care – PPO | Admitting: Internal Medicine

## 2012-04-22 ENCOUNTER — Other Ambulatory Visit: Payer: Self-pay | Admitting: Internal Medicine

## 2012-04-22 DIAGNOSIS — Z1231 Encounter for screening mammogram for malignant neoplasm of breast: Secondary | ICD-10-CM

## 2012-06-01 ENCOUNTER — Other Ambulatory Visit: Payer: Self-pay | Admitting: Family Medicine

## 2012-06-01 ENCOUNTER — Ambulatory Visit
Admission: RE | Admit: 2012-06-01 | Discharge: 2012-06-01 | Disposition: A | Discharge status: Home / Self Care | Payer: Medicare Other | Source: Ambulatory Visit | Attending: Family Medicine | Admitting: Family Medicine

## 2012-06-01 DIAGNOSIS — M545 Low back pain: Secondary | ICD-10-CM

## 2012-06-01 DIAGNOSIS — R29890 Loss of height: Secondary | ICD-10-CM

## 2012-06-01 DIAGNOSIS — Z780 Asymptomatic menopausal state: Secondary | ICD-10-CM

## 2012-06-30 ENCOUNTER — Ambulatory Visit
Admission: RE | Admit: 2012-06-30 | Discharge: 2012-06-30 | Disposition: A | Discharge status: Home / Self Care | Payer: Medicare Other | Source: Ambulatory Visit | Attending: Internal Medicine | Admitting: Internal Medicine

## 2012-06-30 ENCOUNTER — Other Ambulatory Visit: Payer: Self-pay | Admitting: Internal Medicine

## 2012-06-30 ENCOUNTER — Ambulatory Visit
Admission: RE | Admit: 2012-06-30 | Discharge: 2012-06-30 | Disposition: A | Discharge status: Home / Self Care | Payer: Medicare Other | Source: Ambulatory Visit | Attending: Family Medicine | Admitting: Family Medicine

## 2012-06-30 DIAGNOSIS — R29890 Loss of height: Secondary | ICD-10-CM

## 2012-06-30 DIAGNOSIS — Z1231 Encounter for screening mammogram for malignant neoplasm of breast: Secondary | ICD-10-CM

## 2012-06-30 DIAGNOSIS — Z780 Asymptomatic menopausal state: Secondary | ICD-10-CM

## 2012-06-30 MED ORDER — BUDESONIDE-FORMOTEROL FUMARATE 160-4.5 MCG/ACT IN AERO: 1.0000 | INHALATION_SPRAY | Freq: Two times a day (BID) | RESPIRATORY_TRACT | Status: DC

## 2012-06-30 NOTE — Telephone Encounter (Signed)
Called and spoke with patient, patient is requesting symbicort refill sent into [facility].  This has been done. Nothing further needed at this time.

## 2012-07-02 ENCOUNTER — Ambulatory Visit: Payer: Self-pay | Admitting: Obstetrics and Gynecology

## 2012-07-03 ENCOUNTER — Other Ambulatory Visit: Payer: Self-pay | Admitting: Internal Medicine

## 2012-07-03 DIAGNOSIS — R928 Other abnormal and inconclusive findings on diagnostic imaging of breast: Secondary | ICD-10-CM

## 2012-07-10 ENCOUNTER — Ambulatory Visit
Admission: RE | Admit: 2012-07-10 | Discharge: 2012-07-10 | Disposition: A | Discharge status: Home / Self Care | Payer: Medicare Other | Source: Ambulatory Visit | Attending: Internal Medicine | Admitting: Internal Medicine

## 2012-07-10 DIAGNOSIS — R928 Other abnormal and inconclusive findings on diagnostic imaging of breast: Secondary | ICD-10-CM

## 2012-07-21 ENCOUNTER — Ambulatory Visit (INDEPENDENT_AMBULATORY_CARE_PROVIDER_SITE_OTHER): Payer: Medicare Other | Admitting: Obstetrics and Gynecology

## 2012-07-21 ENCOUNTER — Encounter: Payer: Self-pay | Admitting: Obstetrics and Gynecology

## 2012-07-21 VITALS — BP 102/60 | HR 64 | Ht 64.5 in | Wt 154.0 lb

## 2012-07-21 DIAGNOSIS — Z01419 Encounter for gynecological examination (general) (routine) without abnormal findings: Secondary | ICD-10-CM

## 2012-07-21 DIAGNOSIS — Z124 Encounter for screening for malignant neoplasm of cervix: Secondary | ICD-10-CM

## 2012-07-21 NOTE — Progress Notes (Signed)
Regular Periods: no Mammogram: yes  Monthly Breast Ex.: yes Exercise: yes  Tetanus < 10 years: yes Seatbelts: yes  NI. Bladder Functn.: yes Abuse at home: no  Daily BM's: yes Stressful Work: no  Healthy Diet: yes Sigmoid-Colonoscopy: 7 YEARS AGO   Calcium: no Medical problems this year: NONE   LAST PAP:10/12  Contraception: BTL  Mammogram:  9/13 AND 11/13  PCP: DR. Mosetta Putt   PMH: NO CHANGE  FMH: NO CHANGE  Last Bone Scan: 9/13  PT IS MARRIED.

## 2012-07-21 NOTE — Progress Notes (Signed)
Subjective:    Catherine Chambers is a 66 y.o. female G4P2 who presents for annual exam. The patient has no complaints today.        Review of Systems Gastrointestinal:No change in bowel habits, no abdominal pain, no rectal bleeding Genitourinary:negative for abnormal vaginal bleeding,  dysuria, frequency, hematuria, nocturia and urinary incontinence  Objective:     BP 102/60  Pulse 64  Ht 5' 4.5" (1.638 m)  Wt 154 lb (69.854 kg)  BMI 26.03 kg/m2 Weight:  Wt Readings from Last 1 Encounters:  07/21/12 154 lb (69.854 kg)   Body mass index is 26.03 kg/(m^2). General Appearance:  Well nourished in no acute distress HEENT: Grossly normal Neck / Thyroid: Supple, no masses, nodes or enlargement Lungs: Clear to auscultation bilaterally Back: No CVA tenderness Breast Exam: No masses or nodes.No dimpling, nipple retraction or discharge. Cardiovascular: Regular rate and rhythm.  Gastrointestinal: Soft, non-tender, no masses or organomegaly Pelvic Exam: EGBUS-normal, vagina-atrophic mucosa, cervix-atrophic/stenotic, no tenderness or lesions, uterus-ULNS,  adnexae-no masses or tenderness Rectovaginal: deferred due to patient's complaint of flatulence Lymphatic Exam: Non-palpable nodes in neck, clavicular, axillary, or inguinal regions Skin: No rash or abnormalities Extremities: no clubbing cyanosis or edema Neurologic: Grossly normal Psychiatric: Alert and oriented x 3    Assessment:   Routine GYN Exam   Plan:   PAP sent  RTO 1 year for pelvic,  will repeat PAP in 2 years, or prn  Reviewed revised guidelines for PAP smear maintenance schedule.   Nickolai Rinks,[email or web address]

## 2012-07-22 LAB — PAP IG (IMAGE GUIDED)

## 2012-08-04 ENCOUNTER — Ambulatory Visit: Payer: Medicare Other | Admitting: Family Medicine

## 2012-10-06 ENCOUNTER — Ambulatory Visit: Payer: Medicare Other | Admitting: Internal Medicine

## 2012-10-22 ENCOUNTER — Ambulatory Visit (INDEPENDENT_AMBULATORY_CARE_PROVIDER_SITE_OTHER): Payer: Medicare Other | Admitting: Internal Medicine

## 2012-10-22 ENCOUNTER — Encounter: Payer: Self-pay | Admitting: Internal Medicine

## 2012-10-22 VITALS — BP 108/70 | HR 63 | Temp 97.8°F | Ht 64.5 in | Wt 162.8 lb

## 2012-10-22 DIAGNOSIS — J45909 Unspecified asthma, uncomplicated: Secondary | ICD-10-CM

## 2012-10-22 NOTE — Progress Notes (Signed)
Subjective:    Patient ID: Catherine Chambers, female    DOB: 1946/03/24, 67 y.o.   MRN: [id number]  HPI [place] moderate persistent asthma with strong allergy hx. Hx of poor tolerance to advair (tachycardia)  OV 03/18/2011: Last visit mid June 2012. Fev1 was 1.33L/66%, ratio 47. Started symbicort. STates she did really well with same. No tachycardia. Symptoms of dyspnea, chest ache and chest tightness all resolved. Albuterol use dropped to zero. She is pleased. She also picked up URI from grandson 2 weeks ago and developed cough but was pleasantly surprised that during this time there was no dyspnea, wheeze or aluterol use; feels symbicort helped handle URI much better than before. Currently some mild residual improving dry cough that is sporadic wihtout clear cut aggravating or relieving factors. Denies associated dyspnea, sinus drainage, GERD  Spirometry today shows normalization to 1.89L/83% of Fev1   REC You are asthma is much improved Continue symbicort 160/4.5 2 puff twice daily Continue albuterol as needed -if you are resorting to more than 2 times per week of albuterol it means your asthma is getting worse; call us then AT followup we will discuss allergy shots, lowering dose or change to just inhaled steroids alone We discussed asthma resarch study called AUSTRI and we agreed that you wont participate in it Return in 6-8 months or sooner if needed    OV 10/17/2011  Followup moderate persistent asthma. Last visit was 7 months ago on March 18, 2011. Since last visit very compliant with symbicort 160/4.5 2 puff bid. No albuterol use in interim. In winter 2012 had flu like illnes and out of work for 1 week but no ae-asthma. No nocutrnal awakenings due to asthma. Wants to see allergist in [place], [place] - specifically asking for [facility] there to help control allergies. Very keen on trialling symbicort 160 at 1 puff twice daily. Spirometry not done today. Asymptomatic overall  Past,  Family, Social reviewed: no change since last visit other than in HPI  REC You are asthma is very well controlled clinically  Respect your decision not to take flu shot or pneumonia vaccine  Continue symbicort 160/4.5 but okay to reduce it to 1 puff twice daily at your request - nurse will ensure refills  Continue albuterol as needed -if you are resorting to more than 2 times per week of albuterol it means your asthma is getting worse; call us then  At your request we will refer you to Dr Aris Georgia or one of her associates at the [facility] in [place], [place]  Return in 6-8 months or sooner if needed with office spirometry at followup   OV 04/10/2012 [place] moderate persistent asthma with strong allergy hx. Since last visit 6 months ago in feb 2013, has seen Dr Aris Georgia per hx. Skin tests positive per h.  Now on allergy shots which she is not sure is helping or not. In terms of asthma, she has slowly weaned herself off symbicort and off it for 1 month. This is to see if how well she can naturally adapt to disease but past week she is noticing some chest tightness at rest in the evenings. This is mild. Unclear aggravating or relieving factors. No asccoiated cough, wheeze, nocturnal awakenings other than baseline constant state of sinus fulness.   Spirometry today fev1 1.65/80%, ratio 56 (best was 1.89L in feb 2013 on symbicort)  She is very concerned about side effects of inhaled steroids long term but does feel she needs  some inhaled steroids to help symptoms. Also concerned about cost issues.   She does not want flu shot or pneumovax   REC #Asthma  We have agreed that you need some amount of inhaler steroid to control symptoms but we want to balance this with your desire to be on as low inhaled steroids as possible  Therefore, for now go back on symbicort 160/4.5 - take it 1 puff twice daily for 4 months (because you have this supply with you), then go to 80/4.5 1 puff twice  daily  No flu shot or pneumovax per your request  Return to see me in 6 months  Spirometry at followup  Please take the handout on side efects of inhaled steroid which we discussed   OV 10/22/2012   Follow for moderate persistent asthma  Last visit was August 2013. At last visit the plan was to taper herself to Symbicort 80/4.5 one puff once daily. She tried this but had worsening respiratory symptoms particularly over the winter and now she is taking Symbicort 160/4.5 2 puff 2 times a day. This keeps asthma under control. However she is concerned and continues to be concerned about long-term side effects of inhaled steroids despite Extensive counseling of the benefit versus risk ratio. She's a minimalist when it comes to medications. She does not mind allergy shots in the allergy clinic which is taking once a week but she is very against inhaled steroids. She is only taking it reluctantly. Her current question is whether she can taper her Symbicort again.  Currently asthma stable and well controlled with albuterol rescue use less than 2 times a week and no nocturnal symptoms   His spirometry today shows FEV1 1.6 L/80% with a low FEV1 FEC ratio. This FEV1 is similar to August 2013 when she was taking Symbicort at the current dose of 160/4.5 2 puff 2 times a day  Review of Systems  Constitutional: Negative for fever and unexpected weight change.  HENT: Negative for ear pain, nosebleeds, congestion, sore throat, rhinorrhea, sneezing, trouble swallowing, dental problem, postnasal drip and sinus pressure.   Eyes: Negative for redness and itching.  Respiratory: Negative for cough, chest tightness, shortness of breath and wheezing.   Cardiovascular: Negative for palpitations and leg swelling.  Gastrointestinal: Negative for nausea and vomiting.  Genitourinary: Negative for dysuria.  Musculoskeletal: Negative for joint swelling.  Skin: Negative for rash.  Neurological: Negative for headaches.   Hematological: Does not bruise/bleed easily.  Psychiatric/Behavioral: Negative for dysphoric mood. The patient is not nervous/anxious.    Past, Family, Social reviewed: no change since last visit  Current outpatient prescriptions:budesonide-formoterol (SYMBICORT) 160-4.5 MCG/ACT inhaler, Inhale 1 puff into the lungs 2 (two) times daily., Disp: 1 Inhaler, Rfl: 6;  cholecalciferol (VITAMIN D) 1000 UNITS tablet, Take 1,000 Units by mouth daily., Disp: , Rfl: ;  fexofenadine (ALLEGRA) 180 MG tablet, Take 180 mg by mouth as needed., Disp: , Rfl: ;  fluticasone (FLONASE) 50 MCG/ACT nasal spray, Place 2 sprays into the nose daily., Disp: , Rfl:  albuterol (PROVENTIL HFA;VENTOLIN HFA) 108 (90 BASE) MCG/ACT inhaler, Inhale into the lungs every 6 (six) hours as needed., Disp: , Rfl:   Objective:   Physical Exam Vitals reviewed. Constitutional: She is oriented to person, place, and time. She appears well-developed and well-nourished. No distress.   HENT:  Head: Normocephalic and atraumatic.   Mouth/Throat: Oropharynx is clear and moist. No oropharyngeal exudate.  Eyes: Conjunctivae and EOM are normal. Pupils are equal, round, and reactive to  light. Right eye exhibits no discharge. Left eye exhibits no discharge. No scleral icterus.  Neck: Normal range of motion. Neck supple. No JVD present. No tracheal deviation present. No thyromegaly present.  Cardiovascular: Normal rate, regular rhythm, normal heart sounds and intact distal pulses.  Exam reveals no gallop and no friction rub.   No murmur heard. Pulmonary/Chest: Effort normal and breath sounds normal. No respiratory distress. She has no wheezes. She has no rales. She exhibits no tenderness.  Abdominal: Soft. Bowel sounds are normal. She exhibits no distension and no mass. There is no tenderness. There is no rebound and no guarding.  Musculoskeletal: Normal range of motion. She exhibits no edema and no tenderness.  Lymphadenopathy:    She has no  cervical adenopathy.  Neurological: She is alert and oriented to person, place, and time. She has normal reflexes. No cranial nerve deficit. She exhibits normal muscle tone. Coordination normal.  Skin: Skin is warm and dry. No rash noted. She is not diaphoretic. No erythema. No pallor.  Psychiatric: She has a normal mood and affect. Her behavior is normal. Judgment and thought content normal.          Assessment & Plan:

## 2012-10-22 NOTE — Patient Instructions (Addendum)
Your asthma is very  well controlled on Symbicort 160/4.5, 2 puff 2 times a day and once a week allergy shots   - This is based on symptoms and on breathing test both August 2013 and March 2014  You can try tapering to Symbicort 80/4.5, 2 puff 2 times a day. If symptoms get worse with this please call us but you can increase Symbicort 160/4.5, 2 puff 2 times a day  If it appears that you need higher doses of inhaled steroids to keep her asthma under control then one way to reduce dependency on inhaled steroids is to improve control of allergy component with the help of Singulair or Xolair which can be discussed with Dr. Aris Georgia  #Followup 3-6 months Spirometry at followup

## 2012-11-01 NOTE — Assessment & Plan Note (Signed)
Your asthma is very  well controlled on Symbicort 160/4.5, 2 puff 2 times a day and once a week allergy shots   - This is based on symptoms and on breathing test both August 2013 and March 2014  You can try tapering to Symbicort 80/4.5, 2 puff 2 times a day. If symptoms get worse with this please call us but you can increase Symbicort 160/4.5, 2 puff 2 times a day  If it appears that you need higher doses of inhaled steroids to keep her asthma under control then one way to reduce dependency on inhaled steroids is to improve control of allergy component with the help of Singulair or Xolair which can be discussed with Dr. Aris Georgia  #Followup 3-6 months Spirometry at followu

## 2012-11-18 ENCOUNTER — Telehealth: Payer: Self-pay | Admitting: Internal Medicine

## 2012-11-18 MED ORDER — BUDESONIDE-FORMOTEROL FUMARATE 80-4.5 MCG/ACT IN AERO: 2.0000 | INHALATION_SPRAY | Freq: Two times a day (BID) | RESPIRATORY_TRACT | Status: DC

## 2012-11-18 NOTE — Telephone Encounter (Signed)
Spoke with patient, patient requesting symbicort 80 Rx sent to [facility]. Rx has been sent, patient aware and nothing further needed at this time.

## 2013-02-09 ENCOUNTER — Encounter: Payer: Self-pay | Admitting: Family Medicine

## 2013-03-24 ENCOUNTER — Other Ambulatory Visit: Payer: Self-pay

## 2013-03-30 ENCOUNTER — Ambulatory Visit: Payer: Medicare Other | Admitting: Internal Medicine

## 2013-04-15 ENCOUNTER — Ambulatory Visit (INDEPENDENT_AMBULATORY_CARE_PROVIDER_SITE_OTHER): Payer: Medicare Other | Admitting: Internal Medicine

## 2013-04-15 ENCOUNTER — Encounter: Payer: Self-pay | Admitting: Internal Medicine

## 2013-04-15 VITALS — BP 104/68 | HR 54 | Temp 98.5°F | Ht 64.5 in | Wt 150.0 lb

## 2013-04-15 DIAGNOSIS — J45909 Unspecified asthma, uncomplicated: Secondary | ICD-10-CM

## 2013-04-15 DIAGNOSIS — J4540 Moderate persistent asthma, uncomplicated: Secondary | ICD-10-CM | POA: Insufficient documentation

## 2013-04-15 MED ORDER — ALBUTEROL SULFATE HFA 108 (90 BASE) MCG/ACT IN AERS: 1.0000 | INHALATION_SPRAY | Freq: Four times a day (QID) | RESPIRATORY_TRACT | Status: DC | PRN

## 2013-04-15 NOTE — Patient Instructions (Addendum)
Your asthma is very  well controlled on Symbicort 80/4.5, 2 puff 2 times a day and once a week allergy shots and  Allegra and nasal steroid  Have high dose flu shot in fall  Followup  6months or sooner if needed

## 2013-04-15 NOTE — Assessment & Plan Note (Signed)
Your asthma is very  well controlled on Symbicort 80/4.5, 2 puff 2 times a day and once a week allergy shots and  Allegra and nasal steroid  Have high dose flu shot in fall  Followup  6months or sooner if needed 

## 2013-04-15 NOTE — Progress Notes (Signed)
Subjective:    Patient ID: Catherine Chambers, female    DOB: 1945-11-20, 67 y.o.   MRN: [id number]  HPI [place] moderate persistent asthma with strong allergy hx. Hx of poor tolerance to advair (tachycardia)  OV 03/18/2011: Last visit mid June 2012. Fev1 was 1.33L/66%, ratio 47. Started symbicort. STates she did really well with same. No tachycardia. Symptoms of dyspnea, chest ache and chest tightness all resolved. Albuterol use dropped to zero. She is pleased. She also picked up URI from grandson 2 weeks ago and developed cough but was pleasantly surprised that during this time there was no dyspnea, wheeze or aluterol use; feels symbicort helped handle URI much better than before. Currently some mild residual improving dry cough that is sporadic wihtout clear cut aggravating or relieving factors. Denies associated dyspnea, sinus drainage, GERD  Spirometry today shows normalization to 1.89L/83% of Fev1   REC You are asthma is much improved Continue symbicort 160/4.5 2 puff twice daily Continue albuterol as needed -if you are resorting to more than 2 times per week of albuterol it means your asthma is getting worse; call us then AT followup we will discuss allergy shots, lowering dose or change to just inhaled steroids alone We discussed asthma resarch study called AUSTRI and we agreed that you wont participate in it Return in 6-8 months or sooner if needed    OV 10/17/2011  Followup moderate persistent asthma. Last visit was 7 months ago on March 18, 2011. Since last visit very compliant with symbicort 160/4.5 2 puff bid. No albuterol use in interim. In winter 2012 had flu like illnes and out of work for 1 week but no ae-asthma. No nocutrnal awakenings due to asthma. Wants to see allergist in [place], [place] - specifically asking for [facility] there to help control allergies. Very keen on trialling symbicort 160 at 1 puff twice daily. Spirometry not done today. Asymptomatic overall  Past,  Family, Social reviewed: no change since last visit other than in HPI  REC You are asthma is very well controlled clinically  Respect your decision not to take flu shot or pneumonia vaccine  Continue symbicort 160/4.5 but okay to reduce it to 1 puff twice daily at your request - nurse will ensure refills  Continue albuterol as needed -if you are resorting to more than 2 times per week of albuterol it means your asthma is getting worse; call us then  At your request we will refer you to Dr Aris Georgia or one of her associates at the [facility] in [place], [place]  Return in 6-8 months or sooner if needed with office spirometry at followup   OV 04/10/2012 [place] moderate persistent asthma with strong allergy hx. Since last visit 6 months ago in feb 2013, has seen Dr Aris Georgia per hx. Skin tests positive per h.  Now on allergy shots which she is not sure is helping or not. In terms of asthma, she has slowly weaned herself off symbicort and off it for 1 month. This is to see if how well she can naturally adapt to disease but past week she is noticing some chest tightness at rest in the evenings. This is mild. Unclear aggravating or relieving factors. No asccoiated cough, wheeze, nocturnal awakenings other than baseline constant state of sinus fulness.   Spirometry today fev1 1.65/80%, ratio 56 (best was 1.89L in feb 2013 on symbicort)  She is very concerned about side effects of inhaled steroids long term but does feel she needs  some inhaled steroids to help symptoms. Also concerned about cost issues.   She does not want flu shot or pneumovax   REC #Asthma  We have agreed that you need some amount of inhaler steroid to control symptoms but we want to balance this with your desire to be on as low inhaled steroids as possible  Therefore, for now go back on symbicort 160/4.5 - take it 1 puff twice daily for 4 months (because you have this supply with you), then go to 80/4.5 1 puff twice  daily  No flu shot or pneumovax per your request  Return to see me in 6 months  Spirometry at followup  Please take the handout on side efects of inhaled steroid which we discussed   OV 10/22/2012   Follow for moderate persistent asthma  Last visit was August 2013. At last visit the plan was to taper herself to Symbicort 80/4.5 one puff once daily. She tried this but had worsening respiratory symptoms particularly over the winter and now she is taking Symbicort 160/4.5 2 puff 2 times a day. This keeps asthma under control. However she is concerned and continues to be concerned about long-term side effects of inhaled steroids despite Extensive counseling of the benefit versus risk ratio. She's a minimalist when it comes to medications. She does not mind allergy shots in the allergy clinic which is taking once a week but she is very against inhaled steroids. She is only taking it reluctantly. Her current question is whether she can taper her Symbicort again.  Currently asthma stable and well controlled with albuterol rescue use less than 2 times a week and no nocturnal symptoms   His spirometry today shows FEV1 1.6 L/80% with a low FEV1 FEC ratio. This FEV1 is similar to August 2013 when she was taking Symbicort at the current dose of 160/4.5 2 puff 2 times a day  Your asthma is very well controlled on Symbicort 160/4.5, 2 puff 2 times a day and once a week allergy shots  - This is based on symptoms and on breathing test both August 2013 and March 2014  You can try tapering to Symbicort 80/4.5, 2 puff 2 times a day. If symptoms get worse with this please call us but you can increase Symbicort 160/4.5, 2 puff 2 times a day  If it appears that you need higher doses of inhaled steroids to keep her asthma under control then one way to reduce dependency on inhaled steroids is to improve control of allergy component with the help of Singulair or Xolair which can be discussed with Dr. Aris Georgia   #Followup  3-6 months  Spirometry at followup        OV 04/15/2013 FU moderate persistent asthma   - doung well. No albuterol use. On symbicort 80/./4.5 and compliant. Also doing yoga, nasal steorid and allegrea. No nocturanl symptoms. Does not want to taper symbicort. Wants to see how fall goes beforre making decisions n symbicort taper   Past, Family, Social reviewed: no change since last visit    Review of Systems  Constitutional: Negative for fever and unexpected weight change.  HENT: Negative for ear pain, nosebleeds, congestion, sore throat, rhinorrhea, sneezing, trouble swallowing, dental problem, postnasal drip and sinus pressure.   Eyes: Negative for redness and itching.  Respiratory: Negative for cough, chest tightness, shortness of breath and wheezing.   Cardiovascular: Negative for palpitations and leg swelling.  Gastrointestinal: Negative for nausea and vomiting.  Genitourinary: Negative for dysuria.  Musculoskeletal: Negative for joint swelling.  Skin: Negative for rash.  Neurological: Negative for headaches.  Hematological: Does not bruise/bleed easily.  Psychiatric/Behavioral: Negative for dysphoric mood. The patient is not nervous/anxious.    Current outpatient prescriptions:albuterol (PROVENTIL HFA;VENTOLIN HFA) 108 (90 BASE) MCG/ACT inhaler, Inhale 1-2 puffs into the lungs every 6 (six) hours as needed. , Disp: , Rfl: ;  budesonide-formoterol (SYMBICORT) 80-4.5 MCG/ACT inhaler, Inhale 2 puffs into the lungs 2 (two) times daily., Disp: 1 Inhaler, Rfl: 3;  fexofenadine (ALLEGRA) 180 MG tablet, Take 180 mg by mouth as needed., Disp: , Rfl:  fluticasone (FLONASE) 50 MCG/ACT nasal spray, Place 2 sprays into the nose daily., Disp: , Rfl: ;  cholecalciferol (VITAMIN D) 1000 UNITS tablet, Take 1,000 Units by mouth daily., Disp: , Rfl:      Objective:   Physical Exam Vitals reviewed. Constitutional: She is oriented to person, place, and time. She appears  well-developed and well-nourished. No distress.   HENT:  Head: Normocephalic and atraumatic.   Mouth/Throat: Oropharynx is clear and moist. No oropharyngeal exudate.  Eyes: Conjunctivae and EOM are normal. Pupils are equal, round, and reactive to light. Right eye exhibits no discharge. Left eye exhibits no discharge. No scleral icterus.  Neck: Normal range of motion. Neck supple. No JVD present. No tracheal deviation present. No thyromegaly present.  Cardiovascular: Normal rate, regular rhythm, normal heart sounds and intact distal pulses.  Exam reveals no gallop and no friction rub.   No murmur heard. Pulmonary/Chest: Effort normal and breath sounds normal. No respiratory distress. She has no wheezes. She has no rales. She exhibits no tenderness.  Abdominal: Soft. Bowel sounds are normal. She exhibits no distension and no mass. There is no tenderness. There is no rebound and no guarding.  Musculoskeletal: Normal range of motion. She exhibits no edema and no tenderness.  Lymphadenopathy:    She has no cervical adenopathy.  Neurological: She is alert and oriented to person, place, and time. She has normal reflexes. No cranial nerve deficit. She exhibits normal muscle tone. Coordination normal.  Skin: Skin is warm and dry. No rash noted. She is not diaphoretic. No erythema. No pallor.  Psychiatric: She has a normal mood and affect. Her behavior is normal. Judgment and thought content normal.         Assessment & Plan:

## 2013-05-18 ENCOUNTER — Other Ambulatory Visit: Payer: Self-pay

## 2013-05-18 DIAGNOSIS — Z1231 Encounter for screening mammogram for malignant neoplasm of breast: Secondary | ICD-10-CM

## 2013-06-07 ENCOUNTER — Ambulatory Visit (INDEPENDENT_AMBULATORY_CARE_PROVIDER_SITE_OTHER): Payer: Medicare Other

## 2013-06-07 DIAGNOSIS — Z23 Encounter for immunization: Secondary | ICD-10-CM

## 2013-07-01 ENCOUNTER — Ambulatory Visit
Admission: RE | Admit: 2013-07-01 | Discharge: 2013-07-01 | Disposition: A | Discharge status: Home / Self Care | Payer: Medicare Other | Source: Ambulatory Visit

## 2013-07-01 DIAGNOSIS — Z1231 Encounter for screening mammogram for malignant neoplasm of breast: Secondary | ICD-10-CM

## 2013-07-08 ENCOUNTER — Ambulatory Visit: Payer: Medicare Other

## 2013-12-09 ENCOUNTER — Ambulatory Visit: Payer: Medicare Other | Admitting: Internal Medicine

## 2013-12-22 ENCOUNTER — Ambulatory Visit: Payer: Medicare Other | Admitting: Internal Medicine

## 2014-01-06 ENCOUNTER — Encounter: Payer: Self-pay | Admitting: Internal Medicine

## 2014-01-06 ENCOUNTER — Ambulatory Visit (INDEPENDENT_AMBULATORY_CARE_PROVIDER_SITE_OTHER): Payer: Medicare Other | Admitting: Internal Medicine

## 2014-01-06 VITALS — BP 118/72 | HR 61 | Temp 97.7°F | Ht 64.5 in | Wt 151.8 lb

## 2014-01-06 DIAGNOSIS — J45909 Unspecified asthma, uncomplicated: Secondary | ICD-10-CM

## 2014-01-06 DIAGNOSIS — J4540 Moderate persistent asthma, uncomplicated: Secondary | ICD-10-CM

## 2014-01-06 MED ORDER — BUDESONIDE-FORMOTEROL FUMARATE 80-4.5 MCG/ACT IN AERO: 2.0000 | INHALATION_SPRAY | Freq: Two times a day (BID) | RESPIRATORY_TRACT | Status: DC

## 2014-01-06 NOTE — Patient Instructions (Addendum)
Your asthma is very  well controlled on Symbicort 80/4.5, 2 puff 2 times a day and once a week allergy shots and  Allegra and nasal steroid and netti poit  Have high dose flu shot in fall  Followup  9 months or sooner if needed

## 2014-01-06 NOTE — Progress Notes (Signed)
Subjective:    Patient ID: Catherine Chambers, female    DOB: 1946-01-30, 68 y.o.   MRN: [id number]  HPI  [place] moderate persistent asthma with strong allergy hx. Hx of poor tolerance to advair (tachycardia)  OV 03/18/2011: Last visit mid June 2012. Fev1 was 1.33L/66%, ratio 47. Started symbicort. STates she did really well with same. No tachycardia. Symptoms of dyspnea, chest ache and chest tightness all resolved. Albuterol use dropped to zero. She is pleased. She also picked up URI from grandson 2 weeks ago and developed cough but was pleasantly surprised that during this time there was no dyspnea, wheeze or aluterol use; feels symbicort helped handle URI much better than before. Currently some mild residual improving dry cough that is sporadic wihtout clear cut aggravating or relieving factors. Denies associated dyspnea, sinus drainage, GERD  Spirometry today shows normalization to 1.89L/83% of Fev1   REC You are asthma is much improved Continue symbicort 160/4.5 2 puff twice daily Continue albuterol as needed -if you are resorting to more than 2 times per week of albuterol it means your asthma is getting worse; call us then AT followup we will discuss allergy shots, lowering dose or change to just inhaled steroids alone We discussed asthma resarch study called AUSTRI and we agreed that you wont participate in it Return in 6-8 months or sooner if needed    OV 10/17/2011  Followup moderate persistent asthma. Last visit was 7 months ago on March 18, 2011. Since last visit very compliant with symbicort 160/4.5 2 puff bid. No albuterol use in interim. In winter 2012 had flu like illnes and out of work for 1 week but no ae-asthma. No nocutrnal awakenings due to asthma. Wants to see allergist in [place], [place] - specifically asking for [facility] there to help control allergies. Very keen on trialling symbicort 160 at 1 puff twice daily. Spirometry not done today. Asymptomatic  overall  Past, Family, Social reviewed: no change since last visit other than in HPI  REC You are asthma is very well controlled clinically  Respect your decision not to take flu shot or pneumonia vaccine  Continue symbicort 160/4.5 but okay to reduce it to 1 puff twice daily at your request - nurse will ensure refills  Continue albuterol as needed -if you are resorting to more than 2 times per week of albuterol it means your asthma is getting worse; call us then  At your request we will refer you to Dr Hardie Pulley or one of her associates at the [facility] in [place], [place]  Return in 6-8 months or sooner if needed with office spirometry at followup   OV 04/10/2012 [place] moderate persistent asthma with strong allergy hx. Since last visit 6 months ago in feb 2013, has seen Dr Hardie Pulley per hx. Skin tests positive per h.  Now on allergy shots which she is not sure is helping or not. In terms of asthma, she has slowly weaned herself off symbicort and off it for 1 month. This is to see if how well she can naturally adapt to disease but past week she is noticing some chest tightness at rest in the evenings. This is mild. Unclear aggravating or relieving factors. No asccoiated cough, wheeze, nocturnal awakenings other than baseline constant state of sinus fulness.   Spirometry today fev1 1.65/80%, ratio 56 (best was 1.89L in feb 2013 on symbicort)  She is very concerned about side effects of inhaled steroids long term but does feel  she needs some inhaled steroids to help symptoms. Also concerned about cost issues.   She does not want flu shot or pneumovax   REC #Asthma  We have agreed that you need some amount of inhaler steroid to control symptoms but we want to balance this with your desire to be on as low inhaled steroids as possible  Therefore, for now go back on symbicort 160/4.5 - take it 1 puff twice daily for 4 months (because you have this supply with you), then go to  80/4.5 1 puff twice daily  No flu shot or pneumovax per your request  Return to see me in 6 months  Spirometry at followup  Please take the handout on side efects of inhaled steroid which we discussed   OV 10/22/2012   Follow for moderate persistent asthma  Last visit was August 2013. At last visit the plan was to taper herself to Symbicort 80/4.5 one puff once daily. She tried this but had worsening respiratory symptoms particularly over the winter and now she is taking Symbicort 160/4.5 2 puff 2 times a day. This keeps asthma under control. However she is concerned and continues to be concerned about long-term side effects of inhaled steroids despite Extensive counseling of the benefit versus risk ratio. She's a minimalist when it comes to medications. She does not mind allergy shots in the allergy clinic which is taking once a week but she is very against inhaled steroids. She is only taking it reluctantly. Her current question is whether she can taper her Symbicort again.  Currently asthma stable and well controlled with albuterol rescue use less than 2 times a week and no nocturnal symptoms   His spirometry today shows FEV1 1.6 L/80% with a low FEV1 FEC ratio. This FEV1 is similar to August 2013 when she was taking Symbicort at the current dose of 160/4.5 2 puff 2 times a day  Your asthma is very well controlled on Symbicort 160/4.5, 2 puff 2 times a day and once a week allergy shots  - This is based on symptoms and on breathing test both August 2013 and March 2014  You can try tapering to Symbicort 80/4.5, 2 puff 2 times a day. If symptoms get worse with this please call us but you can increase Symbicort 160/4.5, 2 puff 2 times a day  If it appears that you need higher doses of inhaled steroids to keep her asthma under control then one way to reduce dependency on inhaled steroids is to improve control of allergy component with the help of Singulair or Xolair which can be discussed with Dr.  Aris Georgia  #Followup  3-6 months  Spirometry at followup        OV 04/15/2013 FU moderate persistent asthma   - doung well. No albuterol use. On symbicort 80/./4.5 and compliant. Also doing yoga, nasal steorid and allegrea. No nocturanl symptoms. Does not want to taper symbicort. Wants to see how fall goes beforre making decisions n symbicort taper   Past, Family, Social reviewed: no change since last visit  REC - Your asthma is very  well controlled on Symbicort 80/4.5, 2 puff 2 times a day and once a week allergy shots and  Allegra and nasal steroid  -Have high dose flu shot in fall  - Followup  105months or sooner if needed  OV 01/06/2014  Chief Complaint  Patient presents with  . Follow-up    Pt denies any SOB, cough, chest congestion at this time.  FU moderate persistent asthma   - doung well. No albuterol use. On symbicort 80/./4.5 and compliant. Also doing, nasal steorid and allegrea and MediPort.. No nocturanl symptoms. Does not want to taper symbicort.  This is because she has been under social stress -daughter separated from husband in [place]. She feels any taper to symbicort makes her worse. tHis is a change in attitude for her. We discussed BREO but she does not want to change  Past, Family, Social reviewed: daughter separating from husband  Review of Systems  Constitutional: Negative for fever and unexpected weight change.  HENT: Negative for congestion, dental problem, ear pain, nosebleeds, postnasal drip, rhinorrhea, sinus pressure, sneezing, sore throat and trouble swallowing.   Eyes: Negative for redness and itching.  Respiratory: Negative for cough, chest tightness, shortness of breath and wheezing.   Cardiovascular: Negative for palpitations and leg swelling.  Gastrointestinal: Negative for nausea and vomiting.  Genitourinary: Negative for dysuria.  Musculoskeletal: Negative for joint swelling.  Skin: Negative for rash.  Neurological: Negative for headaches.   Hematological: Does not bruise/bleed easily.  Psychiatric/Behavioral: Negative for dysphoric mood. The patient is not nervous/anxious.    Current outpatient prescriptions:albuterol (PROVENTIL HFA;VENTOLIN HFA) 108 (90 BASE) MCG/ACT inhaler, Inhale 1-2 puffs into the lungs every 6 (six) hours as needed., Disp: 1 Inhaler, Rfl: 6;  budesonide-formoterol (SYMBICORT) 80-4.5 MCG/ACT inhaler, Inhale 2 puffs into the lungs 2 (two) times daily., Disp: 1 Inhaler, Rfl: 3;  fexofenadine (ALLEGRA) 180 MG tablet, Take 180 mg by mouth as needed., Disp: , Rfl:  fluticasone (FLONASE) 50 MCG/ACT nasal spray, Place 2 sprays into the nose daily., Disp: , Rfl: ;  cholecalciferol (VITAMIN D) 1000 UNITS tablet, Take 1,000 Units by mouth daily., Disp: , Rfl:      Objective:   Physical Exam  Vitals reviewed. Constitutional: She is oriented to person, place, and time. She appears well-developed and well-nourished. No distress.  HENT:  Head: Normocephalic and atraumatic.  Right Ear: External ear normal.  Left Ear: External ear normal.  Mouth/Throat: Oropharynx is clear and moist. No oropharyngeal exudate.  Eyes: Conjunctivae and EOM are normal. Pupils are equal, round, and reactive to light. Right eye exhibits no discharge. Left eye exhibits no discharge. No scleral icterus.  Neck: Normal range of motion. Neck supple. No JVD present. No tracheal deviation present. No thyromegaly present.  Cardiovascular: Normal rate, regular rhythm, normal heart sounds and intact distal pulses.  Exam reveals no gallop and no friction rub.   No murmur heard. Pulmonary/Chest: Effort normal and breath sounds normal. No respiratory distress. She has no wheezes. She has no rales. She exhibits no tenderness.  Abdominal: Soft. Bowel sounds are normal. She exhibits no distension and no mass. There is no tenderness. There is no rebound and no guarding.  Musculoskeletal: Normal range of motion. She exhibits no edema and no tenderness.   Lymphadenopathy:    She has no cervical adenopathy.  Neurological: She is alert and oriented to person, place, and time. She has normal reflexes. No cranial nerve deficit. She exhibits normal muscle tone. Coordination normal.  Skin: Skin is warm and dry. No rash noted. She is not diaphoretic. No erythema. No pallor.  Psychiatric: She has a normal mood and affect. Her behavior is normal. Judgment and thought content normal.    Filed Vitals:   01/06/14 1504  BP: 118/72  Pulse: 61  Temp: 97.7 F (36.5 C)  TempSrc: Oral  Height: 5' 4.5" (1.638 m)  Weight: 151 lb 12.8 oz (68.856 kg)  SpO2: 100%         Assessment & Plan:

## 2014-01-10 NOTE — Assessment & Plan Note (Signed)
Your asthma is very  well controlled on Symbicort 80/4.5, 2 puff 2 times a day and once a week allergy shots and  Allegra and nasal steroid and netti poit  Have high dose flu shot in fall  Followup  9 months or sooner if needed 

## 2014-04-07 ENCOUNTER — Other Ambulatory Visit: Payer: Self-pay

## 2014-04-07 DIAGNOSIS — Z1231 Encounter for screening mammogram for malignant neoplasm of breast: Secondary | ICD-10-CM

## 2014-06-08 ENCOUNTER — Other Ambulatory Visit: Payer: Self-pay | Admitting: Gastroenterology

## 2014-06-20 ENCOUNTER — Encounter: Payer: Self-pay | Admitting: Internal Medicine

## 2014-06-28 ENCOUNTER — Other Ambulatory Visit: Payer: Self-pay | Admitting: Gastroenterology

## 2014-06-28 ENCOUNTER — Encounter (HOSPITAL_COMMUNITY): Payer: Self-pay | Admitting: *Deleted

## 2014-07-07 ENCOUNTER — Ambulatory Visit: Payer: Medicare Other

## 2014-07-12 ENCOUNTER — Encounter (HOSPITAL_COMMUNITY)
Admission: RE | Disposition: A | Discharge status: Home / Self Care | Payer: Self-pay | Source: Ambulatory Visit | Attending: Gastroenterology

## 2014-07-12 ENCOUNTER — Ambulatory Visit (HOSPITAL_COMMUNITY)
Admission: RE | Admit: 2014-07-12 | Discharge: 2014-07-12 | Disposition: A | Discharge status: Home / Self Care | Payer: Medicare Other | Source: Ambulatory Visit | Attending: Gastroenterology | Admitting: Gastroenterology

## 2014-07-12 ENCOUNTER — Ambulatory Visit (HOSPITAL_COMMUNITY): Payer: Medicare Other | Admitting: Anesthesiology

## 2014-07-12 ENCOUNTER — Ambulatory Visit: Payer: Medicare Other

## 2014-07-12 ENCOUNTER — Encounter (HOSPITAL_COMMUNITY): Payer: Self-pay | Admitting: *Deleted

## 2014-07-12 DIAGNOSIS — J45909 Unspecified asthma, uncomplicated: Secondary | ICD-10-CM | POA: Diagnosis not present

## 2014-07-12 DIAGNOSIS — F418 Other specified anxiety disorders: Secondary | ICD-10-CM | POA: Diagnosis not present

## 2014-07-12 DIAGNOSIS — Z1211 Encounter for screening for malignant neoplasm of colon: Secondary | ICD-10-CM | POA: Diagnosis present

## 2014-07-12 HISTORY — PX: COLONOSCOPY WITH PROPOFOL: SHX5780

## 2014-07-12 SURGERY — COLONOSCOPY WITH PROPOFOL
Anesthesia: Monitor Anesthesia Care

## 2014-07-12 MED ORDER — SODIUM CHLORIDE 0.9 % IV SOLN: INTRAVENOUS | Status: DC

## 2014-07-12 MED ORDER — LACTATED RINGERS IV SOLN
INTRAVENOUS | Status: DC
  Administered 2014-07-12: 1000 mL via INTRAVENOUS

## 2014-07-12 MED ORDER — PROPOFOL 10 MG/ML IV BOLUS
INTRAVENOUS | Status: AC
  Filled 2014-07-12: qty 20

## 2014-07-12 MED ORDER — PROPOFOL INFUSION 10 MG/ML OPTIME
INTRAVENOUS | Status: DC | PRN
  Administered 2014-07-12: 140 ug/kg/min via INTRAVENOUS

## 2014-07-12 MED ORDER — PROPOFOL 10 MG/ML IV BOLUS
INTRAVENOUS | Status: AC
Start: 2014-07-12 — End: 2014-07-12
  Filled 2014-07-12: qty 20

## 2014-07-12 SURGICAL SUPPLY — 22 items

## 2014-07-12 NOTE — Anesthesia Postprocedure Evaluation (Signed)
  Anesthesia Post-op Note  Patient: Catherine Chambers  Procedure(s) Performed: Procedure(s) (LRB): COLONOSCOPY WITH PROPOFOL (N/A)  Patient Location: PACU  Anesthesia Type: MAC  Level of Consciousness: awake and alert   Airway and Oxygen Therapy: Patient Spontanous Breathing  Post-op Pain: mild  Post-op Assessment: Post-op Vital signs reviewed, Patient's Cardiovascular Status Stable, Respiratory Function Stable, Patent Airway and No signs of Nausea or vomiting  Last Vitals:  Filed Vitals:   07/12/14 1344  BP: 101/32  Pulse: 59  Temp:   Resp: 12    Post-op Vital Signs: stable   Complications: No apparent anesthesia complications

## 2014-07-12 NOTE — Op Note (Signed)
Procedure: Screening colonoscopy. Normal screening colonoscopy performed on 05/31/2004  Endoscopist: Earle Gell  Premedication: Propofol administered by anesthesia  Procedure: The patient was placed in the left lateral decubitus position. Anal inspection and digital rectal exam were normal. The Pentax pediatric colonoscope was introduced into the rectum and advanced to the cecum. A normal-appearing appendiceal orifice was identified. A normal-appearing ileocecal valve was intubated and the terminal ileum inspected. Colonic preparation for the exam today was good. Withdrawal time was 8 minutes  Rectum. Normal. Retroflexed view of the distal rectum normal  Sigmoid colon and descending colon. Normal  Splenic flexure. Normal  Transverse colon. Normal  Hepatic flexure. Normal  Ascending colon. Normal  Cecum and ileocecal valve. Normal  Terminal ileum. Normal  Assessment: Normal screening colonoscopy  Recommendation: Schedule repeat screening colonoscopy in 10 years

## 2014-07-12 NOTE — Transfer of Care (Signed)
Immediate Anesthesia Transfer of Care Note  Patient: Catherine Chambers  Procedure(s) Performed: Procedure(s): COLONOSCOPY WITH PROPOFOL (N/A)  Patient Location: PACU  Anesthesia Type:MAC  Level of Consciousness: sedated  Airway & Oxygen Therapy: Patient Spontanous Breathing and Patient connected to nasal cannula oxygen  Post-op Assessment: Report given to PACU RN and Post -op Vital signs reviewed and stable  Post vital signs: Reviewed and stable  Complications: No apparent anesthesia complications

## 2014-07-12 NOTE — H&P (Signed)
  Procedure: Screening colonoscopy. Normal screening colonoscopy performed on 05/31/2004  History: The patient is a 68 year old female born July 29, 1946. She is scheduled to undergo a repeat screening colonoscopy with polypectomy to prevent colon cancer.  Past medical history: Allergic rhinitis. Anxiety. Depression. Tubal ligation. Asthma. Sinus surgery. Hammertoe surgery.  Exam: The patient is alert and lying comfortably on the endoscopy stretcher. Abdomen is soft and nontender to palpation. Lungs are clear to auscultation. Cardiac exam reveals a regular rhythm.  Plan: Proceed with screening colonoscopy

## 2014-07-12 NOTE — Anesthesia Preprocedure Evaluation (Addendum)
Anesthesia Evaluation  Patient identified by MRN, date of birth, ID band Patient awake    Reviewed: Allergy & Precautions, H&P , NPO status , Patient's Chart, lab work & pertinent test results  Airway Mallampati: II  TM Distance: >3 FB Neck ROM: Full    Dental no notable dental hx.    Pulmonary neg pulmonary ROS,  breath sounds clear to auscultation  Pulmonary exam normal       Cardiovascular negative cardio ROS  Rhythm:Regular Rate:Normal     Neuro/Psych negative neurological ROS  negative psych ROS   GI/Hepatic negative GI ROS, Neg liver ROS,   Endo/Other  negative endocrine ROS  Renal/GU negative Renal ROS  negative genitourinary   Musculoskeletal negative musculoskeletal ROS (+)   Abdominal   Peds negative pediatric ROS (+)  Hematology  (+) anemia ,   Anesthesia Other Findings   Reproductive/Obstetrics negative OB ROS                             Anesthesia Physical Anesthesia Plan  ASA: II  Anesthesia Plan: MAC   Post-op Pain Management:    Induction: Intravenous  Airway Management Planned: Nasal Cannula  Additional Equipment:   Intra-op Plan:   Post-operative Plan:   Informed Consent: I have reviewed the patients History and Physical, chart, labs and discussed the procedure including the risks, benefits and alternatives for the proposed anesthesia with the patient or authorized representative who has indicated his/her understanding and acceptance.   Dental advisory given  Plan Discussed with: CRNA and Surgeon  Anesthesia Plan Comments:        Anesthesia Quick Evaluation

## 2014-07-13 ENCOUNTER — Encounter (HOSPITAL_COMMUNITY): Payer: Self-pay | Admitting: Gastroenterology

## 2014-07-27 ENCOUNTER — Ambulatory Visit
Admission: RE | Admit: 2014-07-27 | Discharge: 2014-07-27 | Disposition: A | Discharge status: Home / Self Care | Payer: Medicare Other | Source: Ambulatory Visit

## 2014-07-27 DIAGNOSIS — Z1231 Encounter for screening mammogram for malignant neoplasm of breast: Secondary | ICD-10-CM

## 2014-10-13 ENCOUNTER — Encounter: Payer: Self-pay | Admitting: Internal Medicine

## 2014-10-13 ENCOUNTER — Ambulatory Visit (INDEPENDENT_AMBULATORY_CARE_PROVIDER_SITE_OTHER): Payer: Medicare Other | Admitting: Internal Medicine

## 2014-10-13 VITALS — BP 118/72 | HR 55 | Ht 64.0 in | Wt 156.0 lb

## 2014-10-13 DIAGNOSIS — J4540 Moderate persistent asthma, uncomplicated: Secondary | ICD-10-CM

## 2014-10-13 NOTE — Patient Instructions (Addendum)
ICD-9-CM ICD-10-CM   1. Moderate persistent asthma, uncomplicated 696.29 B28.41     Stable disease Do not stop symbicort Continue 80/4.5 puff atleast at 1 puff twice daily Use albuterol 2 puff  Prn Glad uptodte with flu shot Respect  Your desire to refuse pneumonia vaccine  Followup  - sept 2016 or sooner if needed  - have a low threshold to call us [phone] for any respiratory issues

## 2014-10-13 NOTE — Progress Notes (Signed)
Subjective:    Patient ID: Catherine Chambers, female    DOB: Dec 09, 1945, 69 y.o.   MRN: [id number]  HPI   OV 10/13/2014  Chief Complaint  Patient presents with  . Follow-up    Pt stated her breathing is doing well. Pt stated overall she is doing well. Pt denies, cough and CP/tightness.    Moderate persistent asthma for follow-up. Last seen May 2015. Since then overall asthma has been stable without any emergency room visits, urgent care visits, prednisone usage or hospitalizations. In December 2015 she did a trial off Symbicort but this resulted in recurrence of wheezing and chest tightness and dyspnea. She says the wheezing was significant. However she refused to take the help of an urgent care visit or emergency room visit. She restarted her Symbicort and then over several weeks to a month she slowly got better and feels baseline at this point. Her albuterol use is only rare. She currently uses a Symbicort 80/4.5 at 1 puff twice daily. She does not want increase the dose any further. Overall she says that her philosophy is to  use as little medication as possible. She is up-to-date with her flu shot but is not interested in any pneumonia vaccines     has a past medical history of Chest pain; Asthma; Anxiety; Depression; Anemia; Endocervical polyp; Fibroids; Multiple chemical sensitivity syndrome; and PMB (postmenopausal bleeding).   reports that she has never smoked. She has never used smokeless tobacco.  Past Surgical History  Procedure Laterality Date  . Tubal ligation    . Nasal sinus surgery    . Foot surgery      RIGHT FOOT  . Dilation and curettage of uterus      '73  . Circlage      x2  . Colonoscopy with propofol N/A 07/12/2014    Procedure: COLONOSCOPY WITH PROPOFOL;  Surgeon: Garlan Fair, MD;  Location: [facility];  Service: Endoscopy;  Laterality: N/A;    Allergies  Allergen Reactions  . Amoxicillin Itching  . Cephalexin   . Erythromycin Itching  . Latex  Other (See Comments)    Skin gets irritated and itchy  . Penicillins     Childhood allergy/   . Sulfa Drugs Cross Reactors Nausea Only    Immunization History  Administered Date(s) Administered  . Influenza Split 07/30/2012, 06/19/2014  . Influenza,inj,Quad PF,36+ Mos 06/07/2013    Family History  Problem Relation Age of Onset  . Heart disease Father   . Cancer Mother     abdominal  . Thyroid disease Sister     HYPO THYROID  . Thyroid disease Daughter     HYPO THYROID     Current outpatient prescriptions:  .  albuterol (PROVENTIL HFA;VENTOLIN HFA) 108 (90 BASE) MCG/ACT inhaler, Inhale 1-2 puffs into the lungs every 6 (six) hours as needed., Disp: 1 Inhaler, Rfl: 6 .  budesonide-formoterol (SYMBICORT) 80-4.5 MCG/ACT inhaler, Inhale 2 puffs into the lungs 2 (two) times daily. (Patient taking differently: Inhale 1 puff into the lungs 2 (two) times daily. ), Disp: 1 Inhaler, Rfl: 9 .  PRESCRIPTION MEDICATION, Inject 1 each into the skin once a week. Allergy shot., Disp: , Rfl:     Review of Systems  Constitutional: Negative for fever and unexpected weight change.  HENT: Negative for congestion, dental problem, ear pain, nosebleeds, postnasal drip, rhinorrhea, sinus pressure, sneezing, sore throat and trouble swallowing.   Eyes: Negative for redness and itching.  Respiratory: Negative for cough, chest tightness,  shortness of breath and wheezing.   Cardiovascular: Negative for palpitations and leg swelling.  Gastrointestinal: Negative for nausea and vomiting.  Genitourinary: Negative for dysuria.  Musculoskeletal: Negative for joint swelling.  Skin: Negative for rash.  Neurological: Negative for headaches.  Hematological: Does not bruise/bleed easily.  Psychiatric/Behavioral: Negative for dysphoric mood. The patient is not nervous/anxious.      Current outpatient prescriptions:  .  albuterol (PROVENTIL HFA;VENTOLIN HFA) 108 (90 BASE) MCG/ACT inhaler, Inhale 1-2 puffs into  the lungs every 6 (six) hours as needed., Disp: 1 Inhaler, Rfl: 6 .  budesonide-formoterol (SYMBICORT) 80-4.5 MCG/ACT inhaler, Inhale 2 puffs into the lungs 2 (two) times daily. (Patient taking differently: Inhale 1 puff into the lungs 2 (two) times daily. ), Disp: 1 Inhaler, Rfl: 9 .  PRESCRIPTION MEDICATION, Inject 1 each into the skin once a week. Allergy shot., Disp: , Rfl:        Objective:   Physical Exam  Constitutional: She is oriented to person, place, and time. She appears well-developed and well-nourished. No distress.  HENT:  Head: Normocephalic and atraumatic.  Right Ear: External ear normal.  Left Ear: External ear normal.  Mouth/Throat: Oropharynx is clear and moist. No oropharyngeal exudate.  Eyes: Conjunctivae and EOM are normal. Pupils are equal, round, and reactive to light. Right eye exhibits no discharge. Left eye exhibits no discharge. No scleral icterus.  Neck: Normal range of motion. Neck supple. No JVD present. No tracheal deviation present. No thyromegaly present.  Cardiovascular: Normal rate, regular rhythm, normal heart sounds and intact distal pulses.  Exam reveals no gallop and no friction rub.   No murmur heard. Pulmonary/Chest: Effort normal and breath sounds normal. No respiratory distress. She has no wheezes. She has no rales. She exhibits no tenderness.  Abdominal: Soft. Bowel sounds are normal. She exhibits no distension and no mass. There is no tenderness. There is no rebound and no guarding.  Musculoskeletal: Normal range of motion. She exhibits no edema or tenderness.  Lymphadenopathy:    She has no cervical adenopathy.  Neurological: She is alert and oriented to person, place, and time. She has normal reflexes. No cranial nerve deficit. She exhibits normal muscle tone. Coordination normal.  Skin: Skin is warm and dry. No rash noted. She is not diaphoretic. No erythema. No pallor.  Psychiatric: She has a normal mood and affect. Her behavior is normal.  Judgment and thought content normal.  Vitals reviewed.   Filed Vitals:   10/13/14 1336  BP: 118/72  Pulse: 55  Height: 5\' 4"  (1.626 m)  Weight: 70.761 kg (156 lb)  SpO2: 100%         Assessment & Plan:     ICD-9-CM ICD-10-CM   1. Moderate persistent asthma, uncomplicated 056.97 X48.01     Stable disease Do not stop symbicort ; need it for several years beofre trialing a stop Continue 80/4.5 puff atleast at 1 puff twice daily Use albuterol 2 puff  Prn Glad uptodte with flu shot Respect  Your desire to refuse pneumonia vaccine  Followup  - sept 2016 or sooner if needed  - have a low threshold to call us [phone] for any respiratory issues

## 2015-01-12 ENCOUNTER — Other Ambulatory Visit: Payer: Self-pay | Admitting: Family Medicine

## 2015-01-12 DIAGNOSIS — R1031 Right lower quadrant pain: Secondary | ICD-10-CM

## 2015-01-20 ENCOUNTER — Other Ambulatory Visit: Payer: Medicare Other

## 2015-01-23 ENCOUNTER — Ambulatory Visit
Admission: RE | Admit: 2015-01-23 | Discharge: 2015-01-23 | Disposition: A | Discharge status: Home / Self Care | Payer: Medicare Other | Source: Ambulatory Visit | Attending: Family Medicine | Admitting: Family Medicine

## 2015-01-23 DIAGNOSIS — R1031 Right lower quadrant pain: Secondary | ICD-10-CM

## 2015-01-23 MED ORDER — IOHEXOL 300 MG/ML  SOLN
100.0000 mL | Freq: Once | INTRAMUSCULAR | Status: AC | PRN
  Administered 2015-01-23: 100 mL via INTRAVENOUS

## 2015-02-13 ENCOUNTER — Other Ambulatory Visit: Payer: Self-pay

## 2015-02-22 ENCOUNTER — Telehealth: Payer: Self-pay | Admitting: *Deleted

## 2015-02-22 NOTE — Telephone Encounter (Signed)
Set patient up for Recall for September. Patient notified. Nothing further needed.

## 2015-02-22 NOTE — Telephone Encounter (Signed)
No rush. Have her come in sept/oct 2016

## 2015-02-22 NOTE — Telephone Encounter (Signed)
Patient called to schedule appointment in august for her 6 month follow up.  There are no appointments available in August. There are appointments available this week, but patient is concerned about coming earlier than 6 months because she doesn't think insurance will pay for it. Patient says that the visit will be very short because she is doing fine and has no concerns.

## 2015-03-02 ENCOUNTER — Other Ambulatory Visit: Payer: Self-pay | Admitting: Obstetrics and Gynecology

## 2015-03-14 ENCOUNTER — Encounter: Payer: Medicare Other | Admitting: Adult Health

## 2015-03-17 ENCOUNTER — Other Ambulatory Visit: Payer: Self-pay | Admitting: Obstetrics and Gynecology

## 2015-03-23 ENCOUNTER — Encounter (HOSPITAL_BASED_OUTPATIENT_CLINIC_OR_DEPARTMENT_OTHER): Payer: Self-pay | Admitting: *Deleted

## 2015-03-23 DIAGNOSIS — J45909 Unspecified asthma, uncomplicated: Secondary | ICD-10-CM | POA: Diagnosis not present

## 2015-03-23 DIAGNOSIS — D250 Submucous leiomyoma of uterus: Secondary | ICD-10-CM | POA: Diagnosis not present

## 2015-03-23 DIAGNOSIS — M1990 Unspecified osteoarthritis, unspecified site: Secondary | ICD-10-CM | POA: Diagnosis not present

## 2015-03-23 DIAGNOSIS — M5136 Other intervertebral disc degeneration, lumbar region: Secondary | ICD-10-CM | POA: Diagnosis not present

## 2015-03-23 DIAGNOSIS — N950 Postmenopausal bleeding: Secondary | ICD-10-CM | POA: Diagnosis present

## 2015-03-23 LAB — CBC
HEMATOCRIT: 39.9 % (ref 36.0–46.0)
HEMOGLOBIN: 12.8 g/dL (ref 12.0–15.0)
MCH: 27.4 pg (ref 26.0–34.0)
MCHC: 32.1 g/dL (ref 30.0–36.0)
MCV: 85.4 fL (ref 78.0–100.0)
Platelets: 299 10*3/uL (ref 150–400)
RBC: 4.67 MIL/uL (ref 3.87–5.11)
RDW: 14.2 % (ref 11.5–15.5)
WBC: 6.2 10*3/uL (ref 4.0–10.5)

## 2015-03-23 NOTE — Progress Notes (Signed)
NPO AFTER MN.  ARRIVE AT 1222.  GETTING LAB WORK DONE TODAY.  WILL TAKE TAKE ALLEGRA AND DO SYMBICORT INHALER DOS W/ SIPS OF WATER AND BRING RESCUE INHALER.

## 2015-03-27 ENCOUNTER — Other Ambulatory Visit: Payer: Self-pay | Admitting: Obstetrics and Gynecology

## 2015-03-27 NOTE — H&P (Signed)
Admission History and Physical Exam for a Gynecology Patient  [name] is a 69 y.o. female, G4P2, who presents for hysteroscopy, hysteroscopic resection of submucosal fibroids, and D&C. She has been followed at the [facility] and Gynecology division of [facility]. The patient complains of postmenopausal bleeding. Her most recent Pap smear is within normal limits. A hydro-sonogram showed a 2.6 cm submucosal fibroid.  OB History    Gravida Para Term Preterm AB TAB SAB Ectopic Multiple Living   4 2        2       Past Medical History  Diagnosis Date  . Anxiety   . Depression   . Anemia   . Multiple chemical sensitivity syndrome   . PMB (postmenopausal bleeding)   . History of uterine fibroid   . History of cervical polypectomy     ENDOCERVICAL  . Moderate persistent asthma without complication     PULMOLOGIST-  DR RAMASWAMY  . Perennial allergic rhinitis with seasonal variation   . Chronic allergic conjunctivitis   . Submucous uterine fibroid   . History of concussion     1985-- no residual  . DDD (degenerative disc disease), lumbar     No prescriptions prior to admission    Past Surgical History  Procedure Laterality Date  . Nasal sinus surgery  1989  . Colonoscopy with propofol N/A 07/12/2014    Procedure: COLONOSCOPY WITH PROPOFOL;  Surgeon: Garlan Fair, MD;  Location: [facility];  Service: Endoscopy;  Laterality: N/A;  . Bunionectomy Right 2009  . Dilation and curettage of uterus  1973  . Cervical cerclage  x2    . Tubal ligation  1981    Allergies  Allergen Reactions  . Amoxicillin Hives, Shortness Of Breath and Itching  . Erythromycin Hives, Shortness Of Breath and Swelling  . Latex Rash and Other (See Comments)    Skin gets irritated and itchy  . Sulfa Antibiotics Hives, Shortness Of Breath and Swelling  . Keflex [Cephalexin] Nausea And Vomiting    severe  . Penicillins Hives    Childhood reaction      Family History: family history includes Cancer in her mother; Heart disease in her father; Thyroid disease in her daughter and sister.  Social History:  reports that she has never smoked. She has never used smokeless tobacco. She reports that she does not drink alcohol or use illicit drugs.  Review of systems: See HPI.  Admission Physical Exam:    Body mass index is 23.84 kg/(m^2).  Height 5' 4.5" (1.638 m), weight 141 lb (63.957 kg).  HEENT:                 Within normal limits Chest:                   Clear Heart:                    Regular rate and rhythm Breasts:                No masses, skin changes, bleeding, or discharge present Abdomen:             Nontender, no masses Extremities:          Grossly normal Neurologic exam: Grossly normal  Pelvic exam:  External genitalia: normal general appearance Vaginal: normal without tenderness, induration or masses Cervix: normal appearance Adnexa: normal bimanual exam Uterus: Normal size, shape, and consistency.  Assessment:  Postmenopausal bleeding  Submucosal fibroid  Asthma  Plan:  The patient will undergo hysteroscopy, hysteroscopic resection of submucosal fibroid, and a D&C. She understands the indications for her surgical procedure. She accepts the risk of, but not limited to, and anesthetic complications, bleeding, infections, and possible damage to the surrounding organs.   Eli Hose 03/27/2015

## 2015-03-28 ENCOUNTER — Encounter (HOSPITAL_BASED_OUTPATIENT_CLINIC_OR_DEPARTMENT_OTHER): Payer: Self-pay

## 2015-03-28 ENCOUNTER — Ambulatory Visit (HOSPITAL_BASED_OUTPATIENT_CLINIC_OR_DEPARTMENT_OTHER): Payer: Medicare Other | Admitting: Anesthesiology

## 2015-03-28 ENCOUNTER — Ambulatory Visit (HOSPITAL_BASED_OUTPATIENT_CLINIC_OR_DEPARTMENT_OTHER)
Admission: RE | Admit: 2015-03-28 | Discharge: 2015-03-28 | Disposition: A | Discharge status: Home / Self Care | Payer: Medicare Other | Source: Ambulatory Visit | Attending: Obstetrics and Gynecology | Admitting: Obstetrics and Gynecology

## 2015-03-28 ENCOUNTER — Encounter (HOSPITAL_BASED_OUTPATIENT_CLINIC_OR_DEPARTMENT_OTHER)
Admission: RE | Disposition: A | Discharge status: Home / Self Care | Payer: Self-pay | Source: Ambulatory Visit | Attending: Obstetrics and Gynecology

## 2015-03-28 DIAGNOSIS — N950 Postmenopausal bleeding: Secondary | ICD-10-CM | POA: Diagnosis not present

## 2015-03-28 DIAGNOSIS — J45909 Unspecified asthma, uncomplicated: Secondary | ICD-10-CM | POA: Insufficient documentation

## 2015-03-28 DIAGNOSIS — M5136 Other intervertebral disc degeneration, lumbar region: Secondary | ICD-10-CM | POA: Insufficient documentation

## 2015-03-28 DIAGNOSIS — M1990 Unspecified osteoarthritis, unspecified site: Secondary | ICD-10-CM | POA: Insufficient documentation

## 2015-03-28 DIAGNOSIS — D250 Submucous leiomyoma of uterus: Secondary | ICD-10-CM | POA: Diagnosis not present

## 2015-03-28 HISTORY — DX: Other chronic allergic conjunctivitis: H10.45

## 2015-03-28 HISTORY — DX: Other allergic rhinitis: J30.89

## 2015-03-28 HISTORY — PX: DILITATION & CURRETTAGE/HYSTROSCOPY WITH VERSAPOINT RESECTION: SHX5571

## 2015-03-28 HISTORY — DX: Personal history of other diseases of the female genital tract: Z87.42

## 2015-03-28 HISTORY — DX: Other intervertebral disc degeneration, lumbar region without mention of lumbar back pain or lower extremity pain: M51.369

## 2015-03-28 HISTORY — DX: Submucous leiomyoma of uterus: D25.0

## 2015-03-28 HISTORY — DX: Other intervertebral disc degeneration, lumbar region: M51.36

## 2015-03-28 HISTORY — DX: Moderate persistent asthma, uncomplicated: J45.40

## 2015-03-28 HISTORY — DX: Other seasonal allergic rhinitis: J30.2

## 2015-03-28 HISTORY — DX: Other specified postprocedural states: Z98.890

## 2015-03-28 HISTORY — DX: Personal history of other benign neoplasm: Z86.018

## 2015-03-28 HISTORY — DX: Personal history of traumatic brain injury: Z87.820

## 2015-03-28 LAB — ABO/RH: ABO/RH(D): A NEG

## 2015-03-28 LAB — TYPE AND SCREEN
ABO/RH(D): A NEG
ANTIBODY SCREEN: NEGATIVE

## 2015-03-28 SURGERY — DILATATION & CURETTAGE/HYSTEROSCOPY WITH VERSAPOINT RESECTION
Anesthesia: General | Site: Uterus

## 2015-03-28 MED ORDER — PROMETHAZINE HCL 25 MG/ML IJ SOLN
6.2500 mg | INTRAMUSCULAR | Status: DC | PRN
  Filled 2015-03-28: qty 1

## 2015-03-28 MED ORDER — FENTANYL CITRATE (PF) 100 MCG/2ML IJ SOLN
25.0000 ug | INTRAMUSCULAR | Status: DC | PRN
  Filled 2015-03-28: qty 1

## 2015-03-28 MED ORDER — BUPIVACAINE-EPINEPHRINE 0.5% -1:200000 IJ SOLN
INTRAMUSCULAR | Status: DC | PRN
  Administered 2015-03-28: 10 mL

## 2015-03-28 MED ORDER — DEXAMETHASONE SODIUM PHOSPHATE 4 MG/ML IJ SOLN
INTRAMUSCULAR | Status: DC | PRN
  Administered 2015-03-28: 10 mg via INTRAVENOUS

## 2015-03-28 MED ORDER — 0.9 % SODIUM CHLORIDE (POUR BTL) OPTIME
TOPICAL | Status: DC | PRN
  Administered 2015-03-28: 20 mL

## 2015-03-28 MED ORDER — LACTATED RINGERS IV SOLN
INTRAVENOUS | Status: DC
  Administered 2015-03-28: 09:00:00 via INTRAVENOUS
  Filled 2015-03-28: qty 1000

## 2015-03-28 MED ORDER — PROMETHAZINE HCL 12.5 MG PO TABS: 12.5000 mg | ORAL_TABLET | Freq: Four times a day (QID) | ORAL | Status: DC | PRN

## 2015-03-28 MED ORDER — IBUPROFEN 800 MG PO TABS: 800.0000 mg | ORAL_TABLET | Freq: Three times a day (TID) | ORAL | Status: DC | PRN

## 2015-03-28 MED ORDER — HYDROCODONE-ACETAMINOPHEN 7.5-325 MG PO TABS
1.0000 | ORAL_TABLET | Freq: Once | ORAL | Status: DC | PRN
  Filled 2015-03-28: qty 1

## 2015-03-28 MED ORDER — FENTANYL CITRATE (PF) 100 MCG/2ML IJ SOLN
INTRAMUSCULAR | Status: AC
  Filled 2015-03-28: qty 6

## 2015-03-28 MED ORDER — FENTANYL CITRATE (PF) 100 MCG/2ML IJ SOLN
INTRAMUSCULAR | Status: DC | PRN
  Administered 2015-03-28: 25 ug via INTRAVENOUS
  Administered 2015-03-28: 50 ug via INTRAVENOUS
  Administered 2015-03-28: 25 ug via INTRAVENOUS

## 2015-03-28 MED ORDER — ONDANSETRON HCL 4 MG/2ML IJ SOLN
INTRAMUSCULAR | Status: DC | PRN
  Administered 2015-03-28: 4 mg via INTRAVENOUS

## 2015-03-28 MED ORDER — MIDAZOLAM HCL 5 MG/5ML IJ SOLN
INTRAMUSCULAR | Status: DC | PRN
  Administered 2015-03-28: 2 mg via INTRAVENOUS

## 2015-03-28 MED ORDER — LIDOCAINE HCL (CARDIAC) 20 MG/ML IV SOLN
INTRAVENOUS | Status: DC | PRN
  Administered 2015-03-28: 60 mg via INTRAVENOUS

## 2015-03-28 MED ORDER — KETOROLAC TROMETHAMINE 30 MG/ML IJ SOLN
INTRAMUSCULAR | Status: DC | PRN
  Administered 2015-03-28: 30 mg via INTRAVENOUS

## 2015-03-28 MED ORDER — MIDAZOLAM HCL 2 MG/2ML IJ SOLN
INTRAMUSCULAR | Status: AC
  Filled 2015-03-28: qty 2

## 2015-03-28 MED ORDER — PROPOFOL 10 MG/ML IV BOLUS
INTRAVENOUS | Status: DC | PRN
  Administered 2015-03-28: 180 mg via INTRAVENOUS

## 2015-03-28 MED ORDER — ACETAMINOPHEN 10 MG/ML IV SOLN
INTRAVENOUS | Status: DC | PRN
  Administered 2015-03-28: 1000 mg via INTRAVENOUS

## 2015-03-28 MED ORDER — TRAMADOL HCL 50 MG PO TABS
50.0000 mg | ORAL_TABLET | Freq: Four times a day (QID) | ORAL | Status: DC | PRN
Start: 2015-03-28 — End: 2015-07-17

## 2015-03-28 SURGICAL SUPPLY — 29 items
CANISTER SUCTION 2500CC (MISCELLANEOUS) ×3 IMPLANT
CATH PEDI SILICON 3CC 8FR (CATHETERS) ×2 IMPLANT
CATH ROBINSON RED A/P 16FR (CATHETERS) ×1 IMPLANT
CATH SILICONE 16FRX5CC (CATHETERS) ×2 IMPLANT
CLOTH BEACON ORANGE TIMEOUT ST (SAFETY) IMPLANT
COVER BACK TABLE 60X90IN (DRAPES) ×3 IMPLANT
DRAPE LG THREE QUARTER DISP (DRAPES) ×3 IMPLANT
DRSG TELFA 3X8 NADH (GAUZE/BANDAGES/DRESSINGS) ×3 IMPLANT
ELECT REM PT RETURN 9FT ADLT (ELECTROSURGICAL) ×3
ELECTRODE REM PT RTRN 9FT ADLT (ELECTROSURGICAL) ×1 IMPLANT
ELECTRODE RT ANGLE VERSAPOINT (CUTTING LOOP) ×2 IMPLANT
GLOVE BIOGEL PI IND STRL 8.5 (GLOVE) ×1 IMPLANT
GLOVE BIOGEL PI INDICATOR 8.5 (GLOVE) ×2
GLOVE ECLIPSE 8.0 STRL XLNG CF (GLOVE) ×6 IMPLANT
GLOVE INDICATOR 7.5 STRL GRN (GLOVE) ×6 IMPLANT
GLOVE SURG SS PI 7.5 STRL IVOR (GLOVE) ×2 IMPLANT
GOWN STRL REUS W/ TWL XL LVL3 (GOWN DISPOSABLE) ×2 IMPLANT
GOWN STRL REUS W/TWL XL LVL3 (GOWN DISPOSABLE) ×6
IV NS IRRIG 3000ML ARTHROMATIC (IV SOLUTION) ×2 IMPLANT
LEGGING LITHOTOMY PAIR STRL (DRAPES) ×3 IMPLANT
LOOP ANGLED CUTTING 22FR (CUTTING LOOP) IMPLANT
MANIFOLD NEPTUNE II (INSTRUMENTS) IMPLANT
NS IRRIG 1000ML POUR BTL (IV SOLUTION) ×2 IMPLANT
PACK BASIN DAY SURGERY FS (CUSTOM PROCEDURE TRAY) ×3 IMPLANT
PAD DRESSING TELFA 3X8 NADH (GAUZE/BANDAGES/DRESSINGS) ×1 IMPLANT
PAD OB MATERNITY 4.3X12.25 (PERSONAL CARE ITEMS) ×3 IMPLANT
TOWEL OR 17X24 6PK STRL BLUE (TOWEL DISPOSABLE) ×6 IMPLANT
TRAY DSU PREP LF (CUSTOM PROCEDURE TRAY) ×3 IMPLANT
WATER STERILE IRR 500ML POUR (IV SOLUTION) ×1 IMPLANT

## 2015-03-28 NOTE — Op Note (Signed)
OPERATIVE NOTE  Catherine Chambers  DOB:    03/21/1946  MRN:    [id number]  CSN:    [id number]  Date of Surgery:  03/28/2015  Preoperative Diagnosis:  Postmenopausal bleeding  Submucosal fibroids  Postoperative Diagnosis:  Same  Procedure:  Hysteroscopy with resection of submucosal fibroid Dilatation and curettage  Surgeon:  Gildardo Cranker, M.D.  Assistant:  None  Anesthetic:  General  Disposition:  The patient is a 69 y.o.-year-old female who presents with postmenopausal bleeding and a submucosal fibroid. She understands the indications for her surgical procedure. She accepts the risk of, but not limited to, anesthetic complications, bleeding, infections, and possible damage to the surrounding organs.  Findings:  On examination under anesthesia the uterus was normal size. No adnexal masses were appreciated. No parametrial disease was appreciated. The uterus sounded to 7 cm. The patient was noted to have a 3 cm submucosal fibroid.  Procedure:  The patient was taken to the operating room where a general anesthetic was given. The perineum and vagina were prepped with Betadine. The bladder was drained of urine. The patient was sterilely draped. Examination under anesthesia was performed. A paracervical block was placed using 10 cc of half percent Marcaine with epinephrine. An endocervical curettage was performed. The cervix was gently dilated. The diagnostic hysteroscope was inserted and the cavity was carefully inspected. Pictures were taken. Findings included: A 3 cm submucosal fibroid arising from the posterior surface of the uterus. The diagnostic hysteroscope was removed. The cervix was dilated further. The operative hysteroscope was inserted. The submucosal fibroid wants resected using a single loop using the VersaPoint instrument. The cavity was then curetted using a sharp curet. The cavity was felt to be clean at the end of our procedure. Hemostasis was  adequate. All instruments were removed. The examination was repeated and the uterus was noted to be firm. Sponge, and needle counts were correct. The estimated blood loss for the procedure was 10 cc. The estimated fluid deficit loss 230 cc. The patient was awakened from her anesthetic without difficulty. She was returned to the supine position and and transported to the recovery room in stable condition. The endocervical curettings, endometrial resections, and endometrial curettings were sent to pathology.  Followup instructions:  The patient will return to see Dr. Raphael Gibney in 2 weeks. She was given a copy of the postoperative instructions for patients who've undergone hysteroscopy.  Discharge medications:  Motrin 800 mg every 8 hours as needed for mild to moderate pain. Tramadol 50 mg one tablet every 4 hours as needed for severe pain. Phenergan 12.5 mg every 6 hours as needed for nausea.  Gildardo Cranker, M.D.  03/28/2015

## 2015-03-28 NOTE — Discharge Instructions (Signed)
D & C Home care Instructions:   Personal hygiene:  Used sanitary napkins for vaginal drainage not tampons. Shower or tub bathe the day after your procedure. No douching until bleeding stops. Always wipe from front to back after  Elimination.  Activity: Do not drive or operate any equipment today. The effects of the anesthesia are still present and drowsiness may result. Rest today, not necessarily flat bed rest, just take it easy. You may resume your normal activity in one to 2 days.  Sexual activity: No intercourse for one week or as indicated by your physician  Diet: Eat a light diet as desired this evening. You may resume a regular diet tomorrow.  Return to work: One to 2 days.  General Expectations of your surgery: Vaginal bleeding should be no heavier than a normal period. Spotting may continue up to 10 days. Mild cramps may continue for a couple of days. You may have a regular period in 2-6 weeks.  Unexpected observations call your doctor if these occur: persistent or heavy bleeding. Severe abdominal cramping or pain. Elevation of temperature greater than 100F.  Call for an appointment in one week.      Post Anesthesia Home Care Instructions  Activity: Get plenty of rest for the remainder of the day. A responsible adult should stay with you for 24 hours following the procedure.  For the next 24 hours, DO NOT: -Drive a car -Paediatric nurse -Drink alcoholic beverages -Take any medication unless instructed by your physician -Make any legal decisions or sign important papers.  Meals: Start with liquid foods such as gelatin or soup. Progress to regular foods as tolerated. Avoid greasy, spicy, heavy foods. If nausea and/or vomiting occur, drink only clear liquids until the nausea and/or vomiting subsides. Call your physician if vomiting continues.  Special Instructions/Symptoms: Your throat may feel dry or sore from the anesthesia or the breathing tube placed in your  throat during surgery. If this causes discomfort, gargle with warm salt water. The discomfort should disappear within 24 hours.  If you had a scopolamine patch placed behind your ear for the management of post- operative nausea and/or vomiting:  1. The medication in the patch is effective for 72 hours, after which it should be removed.  Wrap patch in a tissue and discard in the trash. Wash hands thoroughly with soap and water. 2. You may remove the patch earlier than 72 hours if you experience unpleasant side effects which may include dry mouth, dizziness or visual disturbances. 3. Avoid touching the patch. Wash your hands with soap and water after contact with the patch.   Hysteroscopy, Care After  Refer to this sheet in the next few weeks. These instructions provide you with information on caring for yourself after your procedure. Your health care provider may also give you more specific instructions. Your treatment has been planned according to current medical practices, but problems sometimes occur. Call your health care provider if you have any problems or questions after your procedure.  WHAT TO EXPECT AFTER THE PROCEDURE After your procedure, it is typical to have the following:  You may have some cramping. This normally lasts for a couple days.  You may have bleeding. This can vary from light spotting for a few days to menstrual-like bleeding for 3-7 days. HOME CARE INSTRUCTIONS  Rest for the first 1-2 days after the procedure.  Only take over-the-counter or prescription medicines as directed by your health care provider. Do not take aspirin. It can increase  the chances of bleeding.  Take showers instead of baths for 2 weeks or as directed by your health care provider.  Do not drive for 24 hours or as directed.  Do not drink alcohol while taking pain medicine.  Do not use tampons, douche, or have sexual intercourse for 2 weeks or until your health care provider says it is  okay.  Take your temperature twice a day for 4-5 days. Write it down each time.  Follow your health care provider's advice about diet, exercise, and lifting.  If you develop constipation, you may:  Take a mild laxative if your health care provider approves.  Add bran foods to your diet.  Drink enough fluids to keep your urine clear or pale yellow.  Try to have someone with you or available to you for the first 24-48 hours, especially if you were given a general anesthetic.  Follow up with your health care provider as directed. SEEK MEDICAL CARE IF:  You feel dizzy or lightheaded.  You feel sick to your stomach (nauseous).  You have abnormal vaginal discharge.  You have a rash.  You have pain that is not controlled with medicine. SEEK IMMEDIATE MEDICAL CARE IF:  You have bleeding that is heavier than a normal menstrual period.  You have a fever.  You have increasing cramps or pain, not controlled with medicine.  You have new belly (abdominal) pain.  You pass out.  You have pain in the tops of your shoulders (shoulder strap areas).  You have shortness of breath. Document Released: 05/26/2013 Document Reviewed: 05/26/2013 [facility] Patient Information 2015 [place], [place]. This information is not intended to replace advice given to you by your health care provider. Make sure you discuss any questions you have with your health care provider.

## 2015-03-28 NOTE — Transfer of Care (Signed)
Immediate Anesthesia Transfer of Care Note  Patient: Catherine Chambers  Procedure(s) Performed: Procedure(s) (LRB): DILATATION & CURETTAGE/HYSTEROSCOPY WITH VERSAPOINT RESECTION (N/A)  Patient Location: PACU  Anesthesia Type: General  Level of Consciousness: awake, oriented, sedated and patient cooperative  Airway & Oxygen Therapy: Patient Spontanous Breathing and Patient connected to face mask oxygen  Post-op Assessment: Report given to PACU RN and Post -op Vital signs reviewed and stable  Post vital signs: Reviewed and stable  Complications: No apparent anesthesia complications

## 2015-03-28 NOTE — H&P (Signed)
The patient was interviewed and examined today.  The previously documented history and physical examination was reviewed. There are no changes. The operative procedure was reviewed. The risks and benefits were outlined again. The specific risks include, but are not limited to, anesthetic complications, bleeding, infections, and possible damage to the surrounding organs. The patient's questions were answered.  We are ready to proceed as outlined. The likelihood of the patient achieving the goals of this procedure is very likely.   BP 126/67 mmHg  Pulse 55  Temp(Src) 98.4 F (36.9 C) (Oral)  Resp 14  Ht 5' 4.5" (1.638 m)  Wt 146 lb 8 oz (66.452 kg)  BMI 24.77 kg/m2  SpO2 100%  CBC    Component Value Date/Time   WBC 6.2 03/23/2015 1430   RBC 4.67 03/23/2015 1430   HGB 12.8 03/23/2015 1430   HCT 39.9 03/23/2015 1430   PLT 299 03/23/2015 1430   MCV 85.4 03/23/2015 1430   MCH 27.4 03/23/2015 1430   MCHC 32.1 03/23/2015 1430   RDW 14.2 03/23/2015 1430   LYMPHSABS 1.6 01/03/2011 1557   MONOABS 0.5 01/03/2011 1557   EOSABS 0.2 01/03/2011 1557   BASOSABS 0.0 01/03/2011 1557   Gildardo Cranker, M.D.

## 2015-03-28 NOTE — Anesthesia Postprocedure Evaluation (Signed)
  Anesthesia Post-op Note  Patient: Catherine Chambers  Procedure(s) Performed: Procedure(s): DILATATION & CURETTAGE/HYSTEROSCOPY WITH VERSAPOINT RESECTION (N/A)  Patient Location: PACU  Anesthesia Type:General  Level of Consciousness: awake, alert  and oriented  Airway and Oxygen Therapy: Patient Spontanous Breathing  Post-op Pain: none  Post-op Assessment: Post-op Vital signs reviewed              Post-op Vital Signs: Reviewed  Last Vitals:  Filed Vitals:   03/28/15 1225  BP: 131/64  Pulse: 51  Temp: 36.5 C  Resp: 16    Complications: No apparent anesthesia complications

## 2015-03-28 NOTE — Anesthesia Procedure Notes (Signed)
Procedure Name: LMA Insertion Date/Time: 03/28/2015 10:17 AM Performed by: Denna Haggard D Pre-anesthesia Checklist: Patient identified, Emergency Drugs available, Suction available and Patient being monitored Patient Re-evaluated:Patient Re-evaluated prior to inductionOxygen Delivery Method: Circle System Utilized Preoxygenation: Pre-oxygenation with 100% oxygen Intubation Type: IV induction Ventilation: Mask ventilation without difficulty LMA: LMA inserted LMA Size: 4.0 Number of attempts: 1 Airway Equipment and Method: Bite block Placement Confirmation: positive ETCO2 Tube secured with: Tape Dental Injury: Teeth and Oropharynx as per pre-operative assessment

## 2015-03-28 NOTE — Anesthesia Preprocedure Evaluation (Signed)
Anesthesia Evaluation  Patient identified by MRN, date of birth, ID band Patient awake    Reviewed: Allergy & Precautions, NPO status , Patient's Chart, lab work & pertinent test results  Airway Mallampati: I  TM Distance: >3 FB Neck ROM: Full    Dental  (+) Teeth Intact, Dental Advisory Given   Pulmonary asthma ,  breath sounds clear to auscultation        Cardiovascular Exercise Tolerance: Good - angina- DOE Rhythm:Regular Rate:Normal     Neuro/Psych negative neurological ROS     GI/Hepatic negative GI ROS, Neg liver ROS,   Endo/Other  negative endocrine ROS  Renal/GU negative Renal ROS     Musculoskeletal  (+) Arthritis -,   Abdominal   Peds  Hematology negative hematology ROS (+)   Anesthesia Other Findings   Reproductive/Obstetrics                             Anesthesia Physical Anesthesia Plan  ASA: II  Anesthesia Plan: General   Post-op Pain Management:    Induction: Intravenous  Airway Management Planned: LMA  Additional Equipment:   Intra-op Plan:   Post-operative Plan:   Informed Consent: I have reviewed the patients History and Physical, chart, labs and discussed the procedure including the risks, benefits and alternatives for the proposed anesthesia with the patient or authorized representative who has indicated his/her understanding and acceptance.   Dental advisory given  Plan Discussed with: CRNA  Anesthesia Plan Comments:         Anesthesia Quick Evaluation

## 2015-03-29 ENCOUNTER — Encounter (HOSPITAL_BASED_OUTPATIENT_CLINIC_OR_DEPARTMENT_OTHER): Payer: Self-pay | Admitting: Obstetrics and Gynecology

## 2015-03-31 ENCOUNTER — Telehealth: Payer: Self-pay | Admitting: Internal Medicine

## 2015-03-31 NOTE — Telephone Encounter (Signed)
Received records from [facility] & Dr. Derinda Late forwarded 2 pages to Dr. Chase Caller 03/31/15 fbg.

## 2015-06-01 ENCOUNTER — Ambulatory Visit (INDEPENDENT_AMBULATORY_CARE_PROVIDER_SITE_OTHER): Payer: Medicare Other

## 2015-06-01 DIAGNOSIS — Z23 Encounter for immunization: Secondary | ICD-10-CM | POA: Diagnosis not present

## 2015-07-17 ENCOUNTER — Ambulatory Visit (INDEPENDENT_AMBULATORY_CARE_PROVIDER_SITE_OTHER): Payer: Medicare Other | Admitting: Internal Medicine

## 2015-07-17 ENCOUNTER — Encounter: Payer: Self-pay | Admitting: Internal Medicine

## 2015-07-17 VITALS — BP 116/72 | HR 64 | Ht 64.5 in | Wt 148.0 lb

## 2015-07-17 DIAGNOSIS — J4540 Moderate persistent asthma, uncomplicated: Secondary | ICD-10-CM | POA: Diagnosis not present

## 2015-07-17 LAB — NITRIC OXIDE: Nitric Oxide: 37

## 2015-07-17 NOTE — Addendum Note (Signed)
Addended by: Jerrol Banana on: 07/17/2015 10:58 AM   Modules accepted: Orders

## 2015-07-17 NOTE — Patient Instructions (Addendum)
ICD-9-CM ICD-10-CM   1. Moderate persistent asthma, uncomplicated 123456 [id number]     Continue Symbicort 2 puff 2 times a day Albuterol as needed Follow-up in 6 months   - Exhaled nitric oxide and asthma control question at follow-up

## 2015-07-17 NOTE — Progress Notes (Signed)
Subjective:     Patient ID: Catherine Chambers, female   DOB: 1946-06-02, 69 y.o.   MRN: [id number]  HPI   OV 07/17/2015  Chief Complaint  Patient presents with  . Follow-up    Pt c/o PND, mild cough, and hoarseness x 1-2 weeks. Pt denies CP/tightness and f/c/s. Pt states she thinks some of the s/s are from turning on her heat in her house.    Follow-up moderate persistent asthma. Last seen February 2016. After that she has upped her Symbicort to schedule 2 puff 2 times daily. She wants to do this higher dose through the fall and winter season before readjusting downwards. Generally she likes a minimalist approach. She continues to get her allergy shots and is on antihistamines through Dr. Remus Blake at [place]. Currently overall asthma is under control. 2 weeks ago started having a cold that she picked up from a grandchild. This associated with change in weather resulted in hoarse voice and some yellow mucus a week ago. Symptoms are improving. They did get worse over the Thanksgiving holidays because of gas especially the hoarse voice but she is some better. Nevertheless she does not think the symptoms have descended into the lung and she does not think she is in an asthma exacerbation. In fact 5. asthma control question score is 0 showing excellent control.  Specifically she is not waking up in the night because of asthma. And when she wakes up in the morning she has no asthma symptoms. She is not limited in her activities because of asthma. Does not have shortness of breath because of asthma and does not have any wheezing has not used any albuterol for rescue.  Exhaled nitric oxide today  Immunization History  Administered Date(s) Administered  . Influenza Split 07/30/2012, 06/19/2014  . Influenza,inj,Quad PF,36+ Mos 06/07/2013, 06/01/2015  . Pneumococcal Conjugate-13 01/12/2015  . Td 08/20/2010  . Tdap 01/12/2015  . Zoster 02/21/2015      Current outpatient prescriptions:  .   albuterol (PROVENTIL HFA;VENTOLIN HFA) 108 (90 BASE) MCG/ACT inhaler, Inhale 1-2 puffs into the lungs every 6 (six) hours as needed., Disp: 1 Inhaler, Rfl: 6 .  budesonide-formoterol (SYMBICORT) 80-4.5 MCG/ACT inhaler, Inhale 2 puffs into the lungs 2 (two) times daily., Disp: 1 Inhaler, Rfl: 9 .  CALCIUM CITRATE PO, Take 500 mg by mouth daily., Disp: , Rfl:  .  Cholecalciferol (VITAMIN D3) 1000 UNITS CAPS, Take 1 capsule by mouth daily., Disp: , Rfl:  .  fexofenadine-pseudoephedrine (ALLEGRA-D 24) 180-240 MG per 24 hr tablet, Take 1 tablet by mouth every morning., Disp: , Rfl:  .  ibuprofen (ADVIL,MOTRIN) 800 MG tablet, Take 1 tablet (800 mg total) by mouth every 8 (eight) hours as needed., Disp: 50 tablet, Rfl: 1 .  PRESCRIPTION MEDICATION, Inject 1 each into the skin once a week. Allergy shot., Disp: , Rfl:   Allergies  Allergen Reactions  . Amoxicillin Hives, Shortness Of Breath and Itching  . Erythromycin Hives, Shortness Of Breath and Swelling  . Latex Rash and Other (See Comments)    Skin gets irritated and itchy  . Sulfa Antibiotics Hives, Shortness Of Breath and Swelling  . Keflex [Cephalexin] Nausea And Vomiting    severe  . Penicillins Hives    Childhood reaction       Review of Systems According to history of present illness     Objective:   Physical Exam  Constitutional: She is oriented to person, place, and time. She appears well-developed and well-nourished. No  distress.  HENT:  Head: Normocephalic and atraumatic.  Right Ear: External ear normal.  Left Ear: External ear normal.  Mouth/Throat: Oropharynx is clear and moist. No oropharyngeal exudate.  Mild postnasal drip present  Eyes: Conjunctivae and EOM are normal. Pupils are equal, round, and reactive to light. Right eye exhibits no discharge. Left eye exhibits no discharge. No scleral icterus.  Neck: Normal range of motion. Neck supple. No JVD present. No tracheal deviation present. No thyromegaly present.   Cardiovascular: Normal rate, regular rhythm, normal heart sounds and intact distal pulses.  Exam reveals no gallop and no friction rub.   No murmur heard. Pulmonary/Chest: Effort normal and breath sounds normal. No respiratory distress. She has no wheezes. She has no rales. She exhibits no tenderness.  Abdominal: Soft. Bowel sounds are normal. She exhibits no distension and no mass. There is no tenderness. There is no rebound and no guarding.  Musculoskeletal: Normal range of motion. She exhibits no edema or tenderness.  Lymphadenopathy:    She has no cervical adenopathy.  Neurological: She is alert and oriented to person, place, and time. She has normal reflexes. No cranial nerve deficit. She exhibits normal muscle tone. Coordination normal.  Skin: Skin is warm and dry. No rash noted. She is not diaphoretic. No erythema. No pallor.  Psychiatric: She has a normal mood and affect. Her behavior is normal. Judgment and thought content normal.  Vitals reviewed.   Filed Vitals:   07/17/15 0859  BP: 116/72  Pulse: 64  Height: 5' 4.5" (1.638 m)  Weight: 148 lb (67.132 kg)  SpO2: 99%        Assessment:       ICD-9-CM ICD-10-CM   1. Moderate persistent asthma, uncomplicated 123456 123456    Subjectively well controlled. Objectively exhaled nitric oxide is 37 and is in the gray zone suggesting current hoarse voice and cold is causing some amount of inflammation. It is best she stays at Symbicort 2 puffs 2 times a day    Plan:     Continue Symbicort 2 puff 2 times a day Albuterol as needed Follow-up in 6 months   Dr. Brand Males, M.D., [facility].C.P Pulmonary and Critical Care Medicine Staff Physician [place] [facility] Pager: [phone], If no answer or between  15:00h - 7:00h: call [phone]  07/17/2015 9:24 AM

## 2015-10-20 ENCOUNTER — Other Ambulatory Visit: Payer: Self-pay | Admitting: Family Medicine

## 2015-10-20 ENCOUNTER — Ambulatory Visit
Admission: RE | Admit: 2015-10-20 | Discharge: 2015-10-20 | Disposition: A | Discharge status: Home / Self Care | Payer: Medicare Other | Source: Ambulatory Visit | Attending: Family Medicine | Admitting: Family Medicine

## 2015-10-20 DIAGNOSIS — T1490XA Injury, unspecified, initial encounter: Secondary | ICD-10-CM

## 2016-01-16 ENCOUNTER — Ambulatory Visit (INDEPENDENT_AMBULATORY_CARE_PROVIDER_SITE_OTHER): Payer: Medicare Other | Admitting: Internal Medicine

## 2016-01-16 ENCOUNTER — Encounter: Payer: Self-pay | Admitting: Internal Medicine

## 2016-01-16 VITALS — BP 108/58 | HR 56 | Ht 64.5 in | Wt 147.0 lb

## 2016-01-16 DIAGNOSIS — J4540 Moderate persistent asthma, uncomplicated: Secondary | ICD-10-CM

## 2016-01-16 NOTE — Patient Instructions (Signed)
ICD-9-CM ICD-10-CM   1. Moderate persistent asthma, uncomplicated 123456 [id number]     Asthma well controlled Continue symbicort 2 puff twice daily as before - nurse to ensure refills Use albuterol as needed Flu shot in fall  Followup  - 9 months or sooner if needed

## 2016-01-16 NOTE — Progress Notes (Signed)
Subjective:     Patient ID: Catherine Chambers, female   DOB: March 01, 1946, 70 y.o.   MRN: [id number]  HPI   OV 07/17/2015  Chief Complaint  Patient presents with  . Follow-up    Pt c/o PND, mild cough, and hoarseness x 1-2 weeks. Pt denies CP/tightness and f/c/s. Pt states she thinks some of the s/s are from turning on her heat in her house.    Follow-up moderate persistent asthma. Last seen February 2016. After that she has upped her Symbicort to schedule 2 puff 2 times daily. She wants to do this higher dose through the fall and winter season before readjusting downwards. Generally she likes a minimalist approach. She continues to get her allergy shots and is on antihistamines through Dr. Remus Blake at [place]. Currently overall asthma is under control. 2 weeks ago started having a cold that she picked up from a grandchild. This associated with change in weather resulted in hoarse voice and some yellow mucus a week ago. Symptoms are improving. They did get worse over the Thanksgiving holidays because of gas especially the hoarse voice but she is some better. Nevertheless she does not think the symptoms have descended into the lung and she does not think she is in an asthma exacerbation. In fact 5. asthma control question score is 0 showing excellent control.  Specifically she is not waking up in the night because of asthma. And when she wakes up in the morning she has no asthma symptoms. She is not limited in her activities because of asthma. Does not have shortness of breath because of asthma and does not have any wheezing has not used any albuterol for rescue. fenop  37 ppb  OV 01/16/2016  Chief Complaint  Patient presents with  . Follow-up    Pt reports no problems with her breathing. Pt denies cough/wheeze/CP/tightness.      Moderate persistent asthma follow-up. Last visit was November 2016. Since then she is doing well. Asthma control 5. questionnaire is 0 out of 5 showing excellent  control. She does not wake up in the middle of the night because of asthma. When she wakes up symptoms are absent. She is not limited in her activities because of asthma. She does not experience dyspnea because of asthma. She does not use albuterol because of asthma. She does her gardening and she wants to continue her Symbicort 2 puff 2 times daily. She will occasionally take Flonase when she gardens. She does not want exhaled nitric oxide testing because [facility] denied this and she had been $20 out of pocket..     has a past medical history of Anxiety; Depression; Anemia; Multiple chemical sensitivity syndrome; PMB (postmenopausal bleeding); History of uterine fibroid; History of cervical polypectomy; Moderate persistent asthma without complication; Perennial allergic rhinitis with seasonal variation; Chronic allergic conjunctivitis; Submucous uterine fibroid; History of concussion; and DDD (degenerative disc disease), lumbar.   reports that she has never smoked. She has never used smokeless tobacco.  Past Surgical History  Procedure Laterality Date  . Nasal sinus surgery  1989  . Colonoscopy with propofol N/A 07/12/2014    Procedure: COLONOSCOPY WITH PROPOFOL;  Surgeon: Garlan Fair, MD;  Location: [facility];  Service: Endoscopy;  Laterality: N/A;  . Bunionectomy Right 2009  . Dilation and curettage of uterus  1973  . Cervical cerclage  x2    . Tubal ligation  1981  . Dilitation & currettage/hystroscopy with versapoint resection N/A 03/28/2015  Procedure: DILATATION & CURETTAGE/HYSTEROSCOPY WITH VERSAPOINT RESECTION;  Surgeon: Ena Dawley, MD;  Location: [facility];  Service: Gynecology;  Laterality: N/A;    Allergies  Allergen Reactions  . Amoxicillin Hives, Shortness Of Breath and Itching  . Erythromycin Hives, Shortness Of Breath and Swelling  . Latex Rash and Other (See Comments)    Skin gets irritated and itchy  . Sulfa Antibiotics Hives,  Shortness Of Breath and Swelling  . Keflex [Cephalexin] Nausea And Vomiting    severe  . Penicillins Hives    Childhood reaction     Immunization History  Administered Date(s) Administered  . Influenza Split 07/30/2012, 06/19/2014  . Influenza,inj,Quad PF,36+ Mos 06/07/2013, 06/01/2015  . Pneumococcal Conjugate-13 01/12/2015  . Td 08/20/2010  . Tdap 01/12/2015  . Zoster 02/21/2015    Family History  Problem Relation Age of Onset  . Heart disease Father   . Cancer Mother     abdominal  . Thyroid disease Sister     HYPO THYROID  . Thyroid disease Daughter     HYPO THYROID     Current outpatient prescriptions:  .  albuterol (PROVENTIL HFA;VENTOLIN HFA) 108 (90 BASE) MCG/ACT inhaler, Inhale 1-2 puffs into the lungs every 6 (six) hours as needed., Disp: 1 Inhaler, Rfl: 6 .  budesonide-formoterol (SYMBICORT) 80-4.5 MCG/ACT inhaler, Inhale 2 puffs into the lungs 2 (two) times daily., Disp: 1 Inhaler, Rfl: 9 .  CALCIUM CITRATE PO, Take 500 mg by mouth daily., Disp: , Rfl:  .  Cholecalciferol (VITAMIN D3) 1000 UNITS CAPS, Take 1 capsule by mouth daily., Disp: , Rfl:  .  fexofenadine-pseudoephedrine (ALLEGRA-D 24) 180-240 MG per 24 hr tablet, Take 1 tablet by mouth every morning., Disp: , Rfl:  .  ibuprofen (ADVIL,MOTRIN) 800 MG tablet, Take 1 tablet (800 mg total) by mouth every 8 (eight) hours as needed., Disp: 50 tablet, Rfl: 1 .  PRESCRIPTION MEDICATION, Inject 1 each into the skin once a week. Allergy shot., Disp: , Rfl:      Review of Systems     Objective:   Physical Exam  Constitutional: She is oriented to person, place, and time. She appears well-developed and well-nourished. No distress.  HENT:  Head: Normocephalic and atraumatic.  Right Ear: External ear normal.  Left Ear: External ear normal.  Mouth/Throat: Oropharynx is clear and moist. No oropharyngeal exudate.  Eyes: Conjunctivae and EOM are normal. Pupils are equal, round, and reactive to light. Right eye  exhibits no discharge. Left eye exhibits no discharge. No scleral icterus.  Neck: Normal range of motion. Neck supple. No JVD present. No tracheal deviation present. No thyromegaly present.  Cardiovascular: Normal rate, regular rhythm, normal heart sounds and intact distal pulses.  Exam reveals no gallop and no friction rub.   No murmur heard. Pulmonary/Chest: Effort normal and breath sounds normal. No respiratory distress. She has no wheezes. She has no rales. She exhibits no tenderness.  Abdominal: Soft. Bowel sounds are normal. She exhibits no distension and no mass. There is no tenderness. There is no rebound and no guarding.  Musculoskeletal: Normal range of motion. She exhibits no edema or tenderness.  Lymphadenopathy:    She has no cervical adenopathy.  Neurological: She is alert and oriented to person, place, and time. She has normal reflexes. No cranial nerve deficit. She exhibits normal muscle tone. Coordination normal.  Skin: Skin is warm and dry. No rash noted. She is not diaphoretic. No erythema. No pallor.  Psychiatric: She has a normal mood and  affect. Her behavior is normal. Judgment and thought content normal.  Vitals reviewed.   Filed Vitals:   01/16/16 1353  BP: 108/58  Pulse: 56  Height: 5' 4.5" (1.638 m)  Weight: 147 lb (66.679 kg)  SpO2: 98%        Assessment:       ICD-9-CM ICD-10-CM   1. Moderate persistent asthma, uncomplicated 123456 [id number]        Plan:      Asthma well controlled Continue symbicort 2 puff twice daily as before - nurse to ensure refills Use albuterol as needed Flu shot in fall  Followup  - 9 months or sooner if needed    Dr. Brand Males, M.D., [facility].C.P Pulmonary and Critical Care Medicine Staff Physician [place] [facility] Pager: [phone], If no answer or between  15:00h - 7:00h: call [phone]  01/16/2016 2:17 PM

## 2016-01-19 ENCOUNTER — Telehealth: Payer: Self-pay | Admitting: Internal Medicine

## 2016-01-19 NOTE — Telephone Encounter (Signed)
Spoke with pt. States that she read online that she needs to have a follow up Prevnar 13 injection 1 year after the first one and PNA 23. Last Prevnar 13 was given on 01/12/15. Pt is adamant that she have another PNA 23 injection >> last one was in 2014.  MR - please clarifiy if pt needs to repeat her Prevnar 13 and PNA 23 vaccinations.

## 2016-01-22 NOTE — Telephone Encounter (Signed)
prevnar - only once per life time  Pneumovax - needs to meet the following rules - wait 1 year after prevnar and  Age > 65 BUT have to wait 5 years from last pneumovax . I just rechedked this on uptodate.

## 2016-01-22 NOTE — Telephone Encounter (Signed)
Spoke with patient - aware of rec's per MR below Pt requests that we send this note to her PCP to advise that PNA-23 has not been done and she can now get this anytime.  Pt did not have PNA-23 in 2014, it was deferred at that time.  Will send to Dr Sandi Mariscal as Juluis Rainier.

## 2016-04-23 ENCOUNTER — Telehealth: Payer: Self-pay | Admitting: Internal Medicine

## 2016-04-23 ENCOUNTER — Ambulatory Visit (INDEPENDENT_AMBULATORY_CARE_PROVIDER_SITE_OTHER): Payer: Medicare Other

## 2016-04-23 DIAGNOSIS — Z23 Encounter for immunization: Secondary | ICD-10-CM | POA: Diagnosis not present

## 2016-04-23 NOTE — Telephone Encounter (Signed)
FYI to Elise 

## 2016-04-24 NOTE — Telephone Encounter (Signed)
Document has been removed from MR's look at. This information has been updated in the pt's chart. Pt is aware that I have updated this information. Nothing further was needed.

## 2016-05-09 ENCOUNTER — Ambulatory Visit (INDEPENDENT_AMBULATORY_CARE_PROVIDER_SITE_OTHER): Payer: Medicare Other | Admitting: Pulmonary Disease

## 2016-05-09 ENCOUNTER — Encounter: Payer: Self-pay | Admitting: Pulmonary Disease

## 2016-05-09 VITALS — BP 118/76 | HR 64 | Ht 64.0 in | Wt 157.6 lb

## 2016-05-09 DIAGNOSIS — J4540 Moderate persistent asthma, uncomplicated: Secondary | ICD-10-CM

## 2016-05-09 MED ORDER — METHYLPREDNISOLONE ACETATE 80 MG/ML IJ SUSP
80.0000 mg | Freq: Once | INTRAMUSCULAR | Status: AC
  Administered 2016-05-09: 80 mg via INTRAMUSCULAR

## 2016-05-09 MED ORDER — PREDNISONE 10 MG PO TABS: ORAL_TABLET | ORAL | 0 refills | Status: DC

## 2016-05-09 NOTE — Patient Instructions (Signed)
Review of Medrol shot 80 mg today. Start a short prednisone taper at 40 mg. Reduce dose by 20 mg every 2 days. Please resume taking his Symbicort 2 puffs twice daily  Follow up in clinic with Dr. Chase Caller in 1-2 months.

## 2016-05-09 NOTE — Progress Notes (Signed)
Catherine Chambers    [id number]    February 10, 1946  Primary Care Physician:BLOMGREN,PETER F, MD  Referring Physician: Derinda Late, MD [address] [place], [place] [address]  Chief complaint:  Acute visit for asthma exacerbation.  HPI: Catherine Chambers is a 70 year old with moderate persistent asthma. She has been well controlled on Symbicort. She decided to wean herself off of it starting in July of this year. She reduced the number of puffs she took slowly and came completely off it one half weeks ago. Over the past few days she's noticed persistent symptoms, increasing shortness of breath, wheezing, chest tightness, cough with yellow mucus. She has daily nocturnal awakenings and was unable to sleep the whole of last night. She needs to use her albuterol rescue inhaler up to 3 times daily (never needed to use before while on symbicort)   Outpatient Encounter Prescriptions as of 05/09/2016  Medication Sig  . albuterol (PROVENTIL HFA;VENTOLIN HFA) 108 (90 BASE) MCG/ACT inhaler Inhale 1-2 puffs into the lungs every 6 (six) hours as needed.  . budesonide-formoterol (SYMBICORT) 80-4.5 MCG/ACT inhaler Inhale 2 puffs into the lungs 2 (two) times daily.  Marland Kitchen CALCIUM CITRATE PO Take 500 mg by mouth daily.  . Cholecalciferol (VITAMIN D3) 1000 UNITS CAPS Take 1 capsule by mouth daily.  . fexofenadine-pseudoephedrine (ALLEGRA-D 24) 180-240 MG per 24 hr tablet Take 1 tablet by mouth every morning.  . fluticasone (FLONASE) 50 MCG/ACT nasal spray Place 2 sprays into both nostrils daily as needed for allergies or rhinitis.  Marland Kitchen ibuprofen (ADVIL,MOTRIN) 800 MG tablet Take 1 tablet (800 mg total) by mouth every 8 (eight) hours as needed.  Marland Kitchen PRESCRIPTION MEDICATION Inject 1 each into the skin once a week. Allergy shot.   No facility-administered encounter medications on file as of 05/09/2016.     Allergies as of 05/09/2016 - Review Complete 05/09/2016  Allergen Reaction Noted  . Amoxicillin Hives,  Shortness Of Breath, and Itching 01/16/2011  . Erythromycin Hives, Shortness Of Breath, and Swelling 01/16/2011  . Latex Rash and Other (See Comments) 07/12/2014  . Sulfa antibiotics Hives, Shortness Of Breath, and Swelling 03/23/2015  . Keflex [cephalexin] Nausea And Vomiting 03/23/2015  . Penicillins Hives 01/16/2011    Past Medical History:  Diagnosis Date  . Anemia   . Anxiety   . Chronic allergic conjunctivitis   . DDD (degenerative disc disease), lumbar   . Depression   . History of cervical polypectomy    ENDOCERVICAL  . History of concussion    1985-- no residual  . History of uterine fibroid   . Moderate persistent asthma without complication    PULMOLOGIST-  DR RAMASWAMY  . Multiple chemical sensitivity syndrome   . Perennial allergic rhinitis with seasonal variation   . PMB (postmenopausal bleeding)   . Submucous uterine fibroid     Past Surgical History:  Procedure Laterality Date  . BUNIONECTOMY Right 2009  . CERVICAL CERCLAGE  x2    . COLONOSCOPY WITH PROPOFOL N/A 07/12/2014   Procedure: COLONOSCOPY WITH PROPOFOL;  Surgeon: Garlan Fair, MD;  Location: [facility];  Service: Endoscopy;  Laterality: N/A;  . DILATION AND CURETTAGE OF UTERUS  1973  . DILITATION & CURRETTAGE/HYSTROSCOPY WITH VERSAPOINT RESECTION N/A 03/28/2015   Procedure: DILATATION & CURETTAGE/HYSTEROSCOPY WITH VERSAPOINT RESECTION;  Surgeon: Ena Dawley, MD;  Location: [facility];  Service: Gynecology;  Laterality: N/A;  . NASAL SINUS SURGERY  1989  . [place]  Family History  Problem Relation Age of Onset  . Heart disease Father   . Cancer Mother     abdominal  . Thyroid disease Sister     HYPO THYROID  . Thyroid disease Daughter     HYPO THYROID    Social History   Social History  . Marital status: Married    Spouse name: N/A  . Number of children: 2  . Years of education: N/A   Occupational History  . retired Retired   Social History  Main Topics  . Smoking status: Never Smoker  . Smokeless tobacco: Never Used  . Alcohol use No  . Drug use: No  . Sexual activity: Yes    Birth control/ protection: Surgical     Comment: btl   Other Topics Concern  . Not on file   Social History Narrative  . No narrative on file   Review of systems: Review of Systems  Constitutional: Negative for fever and chills.  HENT: Negative.   Eyes: Negative for blurred vision.  Respiratory: as per HPI  Cardiovascular: Negative for chest pain and palpitations.  Gastrointestinal: Negative for vomiting, diarrhea, blood per rectum. Genitourinary: Negative for dysuria, urgency, frequency and hematuria.  Musculoskeletal: Negative for myalgias, back pain and joint pain.  Skin: Negative for itching and rash.  Neurological: Negative for dizziness, tremors, focal weakness, seizures and loss of consciousness.  Endo/Heme/Allergies: Negative for environmental allergies.  Psychiatric/Behavioral: Negative for depression, suicidal ideas and hallucinations.  All other systems reviewed and are negative.  Physical Exam: Blood pressure 118/76, pulse 64, height 5\' 4"  (1.626 m), weight 71.5 kg (157 lb 9.6 oz), SpO2 98 %. Gen:      No acute distress HEENT:  EOMI, sclera anicteric Neck:     No masses; no thyromegaly Lungs:    B/L expiratory wheeze, normal respiratory effort CV:         Regular rate and rhythm; no murmurs Abd:      + bowel sounds; soft, non-tender; no palpable masses, no distension Ext:    No edema; adequate peripheral perfusion Skin:      Warm and dry; no rash Neuro: alert and oriented x 3 Psych: normal mood and affect  Data Reviewed: Spirometry 10/22/12 FVC 2.02 (216%) FEV1 1.61 (80%) F/F 83 Mild airway obstruction.  Assessment:  Moderate persistent asthma with acute exacerbation after she stopped Symbicort.  I recommended to restart the Symbicort and prednisone. She is not very keen on taking more medications than necessary. I  told her to just Symbicort may help eventually but it would take a long time to work. After much negotiation we decided to do Depo shot today and a short pred taper.   She still keen to get herself off the inhaler in the future. I told her it might be better to first try an inhaled steroid alone before taking herself off all controller medications. She'll follow up with Dr. Chase Caller to discuss this further.  Plan/Recommendations: - Depo 80 mg now - Pred taper at 40 mg. Reduce dose by 20 mg every 2 days. - Resume symbicort.  Marshell Garfinkel MD [facility] Pager [phone] 05/09/2016, 2:13 PM  CC: Derinda Late, MD

## 2016-07-09 ENCOUNTER — Ambulatory Visit: Payer: Medicare Other | Admitting: Internal Medicine

## 2016-08-07 ENCOUNTER — Ambulatory Visit: Payer: Medicare Other | Admitting: Internal Medicine

## 2016-09-02 ENCOUNTER — Ambulatory Visit: Payer: Medicare Other | Admitting: Internal Medicine

## 2016-09-23 ENCOUNTER — Ambulatory Visit: Payer: Medicare Other | Admitting: Internal Medicine

## 2016-12-02 ENCOUNTER — Ambulatory Visit: Payer: Medicare Other | Admitting: Internal Medicine

## 2017-01-22 ENCOUNTER — Ambulatory Visit (INDEPENDENT_AMBULATORY_CARE_PROVIDER_SITE_OTHER): Payer: Medicare Other | Admitting: Internal Medicine

## 2017-01-22 ENCOUNTER — Encounter: Payer: Self-pay | Admitting: Internal Medicine

## 2017-01-22 DIAGNOSIS — J4540 Moderate persistent asthma, uncomplicated: Secondary | ICD-10-CM

## 2017-01-22 MED ORDER — BUDESONIDE-FORMOTEROL FUMARATE 80-4.5 MCG/ACT IN AERO: 2.0000 | INHALATION_SPRAY | Freq: Two times a day (BID) | RESPIRATORY_TRACT | 5 refills | Status: DC

## 2017-01-22 NOTE — Patient Instructions (Signed)
Moderate persistent asthma Well controlled but at risk for flare up due to recovering sinus infection  Plan  - no change but continue symbicort 80 at 2 puff twice daily  - continue anti histamine and nasal steroid scheduled - use albuterol as needed - flu shot in fall  Followup 6 months or sooner if needed  - we can discuss inhaler tapering at followup if you are doing well

## 2017-01-22 NOTE — Assessment & Plan Note (Signed)
Well controlled but at risk for flare up due to recovering sinus infection  Plan  - no change but continue symbicort 80 at 2 puff twice daily  - continue anti histamine and nasal steroid scheduled - use albuterol as needed - flu shot in fall  Followup 6 months or sooner if needed  - we can discuss inhaler tapering at followup if you are doing well

## 2017-01-22 NOTE — Progress Notes (Signed)
Subjective:     Patient ID: Catherine Chambers, female   DOB: 05-14-1946, 71 y.o.   MRN: [id number]  HPI  OV 07/17/2015  Chief Complaint  Patient presents with  . Follow-up    Pt c/o PND, mild cough, and hoarseness x 1-2 weeks. Pt denies CP/tightness and f/c/s. Pt states she thinks some of the s/s are from turning on her heat in her house.    Follow-up moderate persistent asthma. Last seen February 2016. After that she has upped her Symbicort to schedule 2 puff 2 times daily. She wants to do this higher dose through the fall and winter season before readjusting downwards. Generally she likes a minimalist approach. She continues to get her allergy shots and is on antihistamines through Dr. Remus Blake at [facility]. Currently overall asthma is under control. 2 weeks ago started having a cold that she picked up from a grandchild. This associated with change in weather resulted in hoarse voice and some yellow mucus a week ago. Symptoms are improving. They did get worse over the Thanksgiving holidays because of gas especially the hoarse voice but she is some better. Nevertheless she does not think the symptoms have descended into the lung and she does not think she is in an asthma exacerbation. In fact 5. asthma control question score is 0 showing excellent control.  Specifically she is not waking up in the night because of asthma. And when she wakes up in the morning she has no asthma symptoms. She is not limited in her activities because of asthma. Does not have shortness of breath because of asthma and does not have any wheezing has not used any albuterol for rescue. fenop  37 ppb  OV 01/16/2016  Chief Complaint  Patient presents with  . Follow-up    Pt reports no problems with her breathing. Pt denies cough/wheeze/CP/tightness.      Moderate persistent asthma follow-up. Last visit was November 2016. Since then she is doing well. Asthma control 5. questionnaire is 0 out of 5 showing excellent  control. She does not wake up in the middle of the night because of asthma. When she wakes up symptoms are absent. She is not limited in her activities because of asthma. She does not experience dyspnea because of asthma. She does not use albuterol because of asthma. She does her gardening and she wants to continue her Symbicort 2 puff 2 times daily. She will occasionally take Flonase when she gardens. She does not want exhaled nitric oxide testing because [facility] denied this and she had been $20 out of pocket..   Acute visit sept 2017  Chief complaint:  Acute visit for asthma exacerbation.  HPI: Catherine Chambers is a 71 year old with moderate persistent asthma. She has been well controlled on Symbicort. She decided to wean herself off of it starting in July of this year. She reduced the number of puffs she took slowly and came completely off it one half weeks ago. Over the past few days she's noticed persistent symptoms, increasing shortness of breath, wheezing, chest tightness, cough with yellow mucus. She has daily nocturnal awakenings and was unable to sleep the whole of last night. She needs to use her albuterol rescue inhaler up to 3 times daily (never needed to use before while on symbicort)    OV 01/22/2017  Chief Complaint  Patient presents with  . Follow-up    Pt states she has a current sinus infection and just completed abx - zpak. Pt states  she is improved but not quite back to baseline. Pt denies cough, CP/tightness, chest congestion, and f/c/s.     71 year old female with moderate persistent asthma. She is now back on Symbicort 2 puff 2 times daily. She initially tried to wean this off but could not and ended up with a flareup late 2017. Since then she's had ear infection and currently is battling a sinus infection. She's finished Z-Pak yesterday and this is improving. She does not feel this is affected her asthma. Her asthma is well controlled without any nocturnal  symptoms or albuterol use or chest tightness or wheezing. She is interested in the new shingles vaccine but wants supplies to drop and will wait till 2019    has a past medical history of Anemia; Anxiety; Chronic allergic conjunctivitis; DDD (degenerative disc disease), lumbar; Depression; History of cervical polypectomy; History of concussion; History of uterine fibroid; Moderate persistent asthma without complication; Multiple chemical sensitivity syndrome; Perennial allergic rhinitis with seasonal variation; PMB (postmenopausal bleeding); and Submucous uterine fibroid.   reports that she has never smoked. She has never used smokeless tobacco.  Past Surgical History:  Procedure Laterality Date  . BUNIONECTOMY Right 2009  . CERVICAL CERCLAGE  x2    . COLONOSCOPY WITH PROPOFOL N/A 07/12/2014   Procedure: COLONOSCOPY WITH PROPOFOL;  Surgeon: Garlan Fair, MD;  Location: [facility];  Service: Endoscopy;  Laterality: N/A;  . DILATION AND CURETTAGE OF UTERUS  1973  . DILITATION & CURRETTAGE/HYSTROSCOPY WITH VERSAPOINT RESECTION N/A 03/28/2015   Procedure: DILATATION & CURETTAGE/HYSTEROSCOPY WITH VERSAPOINT RESECTION;  Surgeon: Ena Dawley, MD;  Location: [facility];  Service: Gynecology;  Laterality: N/A;  . NASAL SINUS SURGERY  1989  . TUBAL LIGATION  1981    Allergies  Allergen Reactions  . Amoxicillin Hives, Shortness Of Breath and Itching  . Erythromycin Hives, Shortness Of Breath and Swelling  . Latex Rash and Other (See Comments)    Skin gets irritated and itchy  . Sulfa Antibiotics Hives, Shortness Of Breath and Swelling  . Keflex [Cephalexin] Nausea And Vomiting    severe  . Penicillins Hives    Childhood reaction     Immunization History  Administered Date(s) Administered  . Influenza Split 07/30/2012, 06/19/2014  . Influenza,inj,Quad PF,36+ Mos 06/07/2013, 06/01/2015, 04/23/2016  . Pneumococcal Conjugate-13 01/12/2015  . Pneumococcal  Polysaccharide-23 01/25/2016  . Td 08/20/2010  . Tdap 01/12/2015  . Zoster 02/21/2015    Family History  Problem Relation Age of Onset  . Heart disease Father   . Cancer Mother        abdominal  . Thyroid disease Sister        HYPO THYROID  . Thyroid disease Daughter        HYPO THYROID     Current Outpatient Prescriptions:  .  ALBUTEROL SULFATE IN, Inhale into the lungs. respiclick, Disp: , Rfl:  .  budesonide-formoterol (SYMBICORT) 80-4.5 MCG/ACT inhaler, Inhale 2 puffs into the lungs 2 (two) times daily., Disp: 1 Inhaler, Rfl: 9 .  CALCIUM CITRATE PO, Take 500 mg by mouth daily., Disp: , Rfl:  .  Cholecalciferol (VITAMIN D3) 1000 UNITS CAPS, Take 1 capsule by mouth daily., Disp: , Rfl:  .  fexofenadine-pseudoephedrine (ALLEGRA-D 24) 180-240 MG per 24 hr tablet, Take 1 tablet by mouth every morning., Disp: , Rfl:  .  fluticasone (FLONASE) 50 MCG/ACT nasal spray, Place 2 sprays into both nostrils daily as needed for allergies or rhinitis., Disp: , Rfl:  .  ibuprofen (ADVIL,MOTRIN)  800 MG tablet, Take 1 tablet (800 mg total) by mouth every 8 (eight) hours as needed., Disp: 50 tablet, Rfl: 1 .  PRESCRIPTION MEDICATION, Inject 1 each into the skin once a week. Allergy shot., Disp: , Rfl:    Review of Systems     Objective:   Physical Exam  Constitutional: She is oriented to person, place, and time. She appears well-developed and well-nourished. No distress.  HENT:  Head: Normocephalic and atraumatic.  Right Ear: External ear normal.  Left Ear: External ear normal.  Mouth/Throat: Oropharynx is clear and moist. No oropharyngeal exudate.  Eyes: Conjunctivae and EOM are normal. Pupils are equal, round, and reactive to light. Right eye exhibits no discharge. Left eye exhibits no discharge. No scleral icterus.  Neck: Normal range of motion. Neck supple. No JVD present. No tracheal deviation present. No thyromegaly present.  Cardiovascular: Normal rate, regular rhythm, normal heart  sounds and intact distal pulses.  Exam reveals no gallop and no friction rub.   No murmur heard. Pulmonary/Chest: Effort normal and breath sounds normal. No respiratory distress. She has no wheezes. She has no rales. She exhibits no tenderness.  Abdominal: Soft. Bowel sounds are normal. She exhibits no distension and no mass. There is no tenderness. There is no rebound and no guarding.  Musculoskeletal: Normal range of motion. She exhibits no edema or tenderness.  Lymphadenopathy:    She has no cervical adenopathy.  Neurological: She is alert and oriented to person, place, and time. She has normal reflexes. No cranial nerve deficit. She exhibits normal muscle tone. Coordination normal.  Skin: Skin is warm and dry. No rash noted. She is not diaphoretic. No erythema. No pallor.  Psychiatric: She has a normal mood and affect. Her behavior is normal. Judgment and thought content normal.  Vitals reviewed.  Vitals:   01/22/17 0948  BP: 106/64  Pulse: (!) 57  SpO2: 97%  Weight: 162 lb (73.5 kg)  Height: 5\' 4"  (1.626 m)   Estimated body mass index is 27.81 kg/m as calculated from the following:   Height as of this encounter: 5\' 4"  (1.626 m).   Weight as of this encounter: 162 lb (73.5 kg).     Assessment:       ICD-10-CM   1. Moderate persistent asthma without complication O84.16        Plan:     Moderate persistent asthma Well controlled but at risk for flare up due to recovering sinus infection  Plan  - no change but continue symbicort 80 at 2 puff twice daily  - continue anti histamine and nasal steroid scheduled - use albuterol as needed - flu shot in fall  Followup 6 months or sooner if needed  - we can discuss inhaler tapering at followup if you are doing well    Dr. Brand Males, M.D., [facility].C.P Pulmonary and Critical Care Medicine Staff Physician [place] [facility] Pager: [phone], If no answer or between  15:00h -  7:00h: call [phone]  01/22/2017 10:15 AM

## 2017-06-17 ENCOUNTER — Other Ambulatory Visit: Payer: Self-pay | Admitting: Obstetrics and Gynecology

## 2017-06-17 DIAGNOSIS — Z1231 Encounter for screening mammogram for malignant neoplasm of breast: Secondary | ICD-10-CM

## 2017-08-05 ENCOUNTER — Encounter: Payer: Self-pay | Admitting: Internal Medicine

## 2017-08-05 ENCOUNTER — Ambulatory Visit (INDEPENDENT_AMBULATORY_CARE_PROVIDER_SITE_OTHER): Payer: BC Managed Care – PPO | Admitting: Internal Medicine

## 2017-08-05 VITALS — BP 110/60 | HR 58 | Ht 64.5 in | Wt 167.2 lb

## 2017-08-05 DIAGNOSIS — J4540 Moderate persistent asthma, uncomplicated: Secondary | ICD-10-CM | POA: Diagnosis not present

## 2017-08-05 MED ORDER — BUDESONIDE-FORMOTEROL FUMARATE 80-4.5 MCG/ACT IN AERO: 2.0000 | INHALATION_SPRAY | Freq: Two times a day (BID) | RESPIRATORY_TRACT | 5 refills | Status: DC

## 2017-08-05 NOTE — Addendum Note (Signed)
Addended by: Lorane Gell on: 08/05/2017 02:48 PM   Modules accepted: Orders

## 2017-08-05 NOTE — Addendum Note (Signed)
Addended by: Mathis Bud on: 08/05/2017 01:02 PM   Modules accepted: Orders

## 2017-08-05 NOTE — Patient Instructions (Addendum)
Moderate persistent asthma Well controlled  Plan  - no change but continue symbicort 80 at 2 puff twice daily  - continue anti histamine and nasal steroid scheduled - use albuterol as needed   Followup 7-8 months or sooner if needed  - acq/feno at followup

## 2017-08-05 NOTE — Progress Notes (Signed)
Subjective:     Patient ID: Catherine Chambers, female   DOB: [birth year], 71 y.o.   MRN: [id number]  HPI  OV 07/17/2015  Chief Complaint  Patient presents with  . Follow-up    Pt c/o PND, mild cough, and hoarseness x 1-2 weeks. Pt denies CP/tightness and f/c/s. Pt states she thinks some of the s/s are from turning on her heat in her house.    Follow-up moderate persistent asthma. Last seen February 2016. After that she has upped her Symbicort to schedule 2 puff 2 times daily. She wants to do this higher dose through the fall and winter season before readjusting downwards. Generally she likes a minimalist approach. She continues to get her allergy shots and is on antihistamines through Dr. Remus Blake at [place]. Currently overall asthma is under control. 2 weeks ago started having a cold that she picked up from a grandchild. This associated with change in weather resulted in hoarse voice and some yellow mucus a week ago. Symptoms are improving. They did get worse over the Thanksgiving holidays because of gas especially the hoarse voice but she is some better. Nevertheless she does not think the symptoms have descended into the lung and she does not think she is in an asthma exacerbation. In fact 5. asthma control question score is 0 showing excellent control.  Specifically she is not waking up in the night because of asthma. And when she wakes up in the morning she has no asthma symptoms. She is not limited in her activities because of asthma. Does not have shortness of breath because of asthma and does not have any wheezing has not used any albuterol for rescue. fenop  37 ppb  OV 01/16/2016  Chief Complaint  Patient presents with  . Follow-up    Pt reports no problems with her breathing. Pt denies cough/wheeze/CP/tightness.      Moderate persistent asthma follow-up. Last visit was November 2016. Since then she is doing well. Asthma control 5. questionnaire is 0 out of 5 showing excellent  control. She does not wake up in the middle of the night because of asthma. When she wakes up symptoms are absent. She is not limited in her activities because of asthma. She does not experience dyspnea because of asthma. She does not use albuterol because of asthma. She does her gardening and she wants to continue her Symbicort 2 puff 2 times daily. She will occasionally take Flonase when she gardens. She does not want exhaled nitric oxide testing because [facility] denied this and she had been $20 out of pocket..   Acute visit sept 2017  Chief complaint:  Acute visit for asthma exacerbation.  HPI: Catherine Chambers is a 71 year old with moderate persistent asthma. She has been well controlled on Symbicort. She decided to wean herself off of it starting in July of this year. She reduced the number of puffs she took slowly and came completely off it one half weeks ago. Over the past few days she's noticed persistent symptoms, increasing shortness of breath, wheezing, chest tightness, cough with yellow mucus. She has daily nocturnal awakenings and was unable to sleep the whole of last night. She needs to use her albuterol rescue inhaler up to 3 times daily (never needed to use before while on symbicort)    OV 01/22/2017  Chief Complaint  Patient presents with  . Follow-up    Pt states she has a current sinus infection and just completed abx - zpak. Pt states  she is improved but not quite back to baseline. Pt denies cough, CP/tightness, chest congestion, and f/c/s.     71 year old female with moderate persistent asthma. She is now back on Symbicort 2 puff 2 times daily. She initially tried to wean this off but could not and ended up with a flareup late 2017. Since then she's had ear infection and currently is battling a sinus infection. She's finished Z-Pak yesterday and this is improving. She does not feel this is affected her asthma. Her asthma is well controlled without any nocturnal  symptoms or albuterol use or chest tightness or wheezing. She is interested in the new shingles vaccine but wants supplies to drop and will wait till 2019  OV 08/05/2017  Chief Complaint  Patient presents with  . Follow-up    Pt states that she has been doing good. States that she has had a couple flare-ups with asthma since last visit. States that she does have mild SOB but denies any current complaints of cough or CP.    71 year old female with moderate persistent asthma.  She continues on Symbicort 80/4.52 puff 2 times daily.  She feels she cannot wean this off because even on this she has up-and-down days.  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  Periodically she will get more congestion and chest tightness depending on the weather.  Nevertheless is all within a band where she does not have to use albuterol for rescue.  She says Symbicort helps her.  Asthma control questionnaire is 0.6 out of 5 showing good control.  She is not waking up in the middle of the night with symptoms.  When she wakes up she is does not have any symptoms.  In the daytime she is very slightly limited because of asthma.  She is experiencing very little shortness of breath and is wheezing hardly any of the time.  Not using albuterol for rescue.  She is up-to-date with her flu shot     Results for Catherine Chambers (MRN [id number]) as of 08/05/2017 12:24  Ref. Range 07/17/2015 10:00  Nitric Oxide Unknown 37     has a past medical history of Anemia, Anxiety, Chronic allergic conjunctivitis, DDD (degenerative disc disease), lumbar, Depression, History of cervical polypectomy, History of concussion, History of uterine fibroid, Moderate persistent asthma without complication, Multiple chemical sensitivity syndrome, Perennial allergic rhinitis with seasonal variation, PMB (postmenopausal bleeding), and Submucous uterine fibroid.   reports that  has  never smoked. she has never used smokeless tobacco.  Past Surgical History:  Procedure Laterality Date  . BUNIONECTOMY Right 2009  . CERVICAL CERCLAGE  x2    . COLONOSCOPY WITH PROPOFOL N/A 07/12/2014   Procedure: COLONOSCOPY WITH PROPOFOL;  Surgeon: Garlan Fair, MD;  Location: [facility];  Service: Endoscopy;  Laterality: N/A;  . DILATION AND CURETTAGE OF UTERUS  1973  . DILITATION & CURRETTAGE/HYSTROSCOPY WITH VERSAPOINT RESECTION N/A 03/28/2015   Procedure: DILATATION & CURETTAGE/HYSTEROSCOPY WITH VERSAPOINT RESECTION;  Surgeon: Ena Dawley, MD;  Location: [facility];  Service: Gynecology;  Laterality: N/A;  . NASAL SINUS SURGERY  1989  . TUBAL LIGATION  1981    Allergies  Allergen Reactions  . Amoxicillin Hives, Shortness Of Breath and Itching  . Erythromycin Hives, Shortness Of Breath and Swelling  . Latex Rash and Other (See Comments)    Skin gets irritated and itchy  . Sulfa Antibiotics Hives, Shortness Of  Breath and Swelling  . Keflex [Cephalexin] Nausea And Vomiting    severe  . Penicillins Hives    Childhood reaction     Immunization History  Administered Date(s) Administered  . Influenza Split 07/30/2012, 06/19/2014  . Influenza, High Dose Seasonal PF 04/19/2017  . Influenza,inj,Quad PF,6+ Mos 06/07/2013, 06/01/2015, 04/23/2016  . Pneumococcal Conjugate-13 01/12/2015  . Pneumococcal Polysaccharide-23 01/25/2016  . Td 08/20/2010  . Tdap 01/12/2015  . Zoster 02/21/2015    Family History  Problem Relation Age of Onset  . Heart disease Father   . Cancer Mother        abdominal  . Thyroid disease Sister        HYPO THYROID  . Thyroid disease Daughter        HYPO THYROID     Current Outpatient Medications:  .  ALBUTEROL SULFATE IN, Inhale into the lungs. respiclick, Disp: , Rfl:  .  aspirin 325 MG tablet, Take 325 mg by mouth as needed., Disp: , Rfl:  .  budesonide-formoterol (SYMBICORT) 80-4.5 MCG/ACT inhaler, Inhale 2 puffs into  the lungs 2 (two) times daily., Disp: 1 Inhaler, Rfl: 5 .  CALCIUM CITRATE PO, Take 500 mg by mouth daily., Disp: , Rfl:  .  Cholecalciferol (VITAMIN D3) 1000 UNITS CAPS, Take 1 capsule by mouth daily., Disp: , Rfl:  .  fexofenadine-pseudoephedrine (ALLEGRA-D 24) 180-240 MG per 24 hr tablet, Take 1 tablet by mouth every morning., Disp: , Rfl:  .  fluticasone (FLONASE) 50 MCG/ACT nasal spray, Place 2 sprays into both nostrils daily as needed for allergies or rhinitis., Disp: , Rfl:    Review of Systems     Objective:   Physical Exam  Constitutional: She is oriented to person, place, and time. She appears well-developed and well-nourished. No distress.  HENT:  Head: Normocephalic and atraumatic.  Right Ear: External ear normal.  Left Ear: External ear normal.  Mouth/Throat: Oropharynx is clear and moist. No oropharyngeal exudate.  Eyes: Conjunctivae and EOM are normal. Pupils are equal, round, and reactive to light. Right eye exhibits no discharge. Left eye exhibits no discharge. No scleral icterus.  Neck: Normal range of motion. Neck supple. No JVD present. No tracheal deviation present. No thyromegaly present.  Cardiovascular: Normal rate, regular rhythm, normal heart sounds and intact distal pulses. Exam reveals no gallop and no friction rub.  No murmur heard. Pulmonary/Chest: Effort normal and breath sounds normal. No respiratory distress. She has no wheezes. She has no rales. She exhibits no tenderness.  Abdominal: Soft. Bowel sounds are normal. She exhibits no distension and no mass. There is no tenderness. There is no rebound and no guarding.  Musculoskeletal: Normal range of motion. She exhibits no edema or tenderness.  Lymphadenopathy:    She has no cervical adenopathy.  Neurological: She is alert and oriented to person, place, and time. She has normal reflexes. No cranial nerve deficit. She exhibits normal muscle tone. Coordination normal.  Skin: Skin is warm and dry. No rash  noted. She is not diaphoretic. No erythema. No pallor.  Psychiatric: She has a normal mood and affect. Her behavior is normal. Judgment and thought content normal.  Vitals reviewed.   Vitals:   08/05/17 1209  BP: 110/60  Pulse: (!) 58  SpO2: 100%  Weight: 167 lb 3.2 oz (75.8 kg)  Height: 5' 4.5" (1.638 m)         Assessment:       ICD-10-CM   1. Moderate persistent asthma without complication P80.99  Plan:     Moderate persistent asthma Well controlled  Plan  - no change but continue symbicort 80 at 2 puff twice daily  - continue anti histamine and nasal steroid scheduled - use albuterol as needed   Followup 7-8 months or sooner if needed  - acq/feno at followup   Dr. Brand Males, M.D., [facility].C.P Pulmonary and Critical Care Medicine Staff Physician, [place] Director - Interstitial Lung Disease  Program  Pulmonary [place] [facility] [place], [place], [address]  Pager: [phone], If no answer or between  15:00h - 7:00h: call [phone] Telephone: [phone]

## 2017-08-06 ENCOUNTER — Ambulatory Visit
Admission: RE | Admit: 2017-08-06 | Discharge: 2017-08-06 | Disposition: A | Discharge status: Home / Self Care | Payer: Medicare Other | Source: Ambulatory Visit | Attending: Obstetrics and Gynecology | Admitting: Obstetrics and Gynecology

## 2017-08-06 DIAGNOSIS — Z1231 Encounter for screening mammogram for malignant neoplasm of breast: Secondary | ICD-10-CM

## 2018-03-04 ENCOUNTER — Other Ambulatory Visit: Payer: Self-pay | Admitting: Internal Medicine

## 2018-03-30 ENCOUNTER — Other Ambulatory Visit: Payer: Self-pay | Admitting: Internal Medicine

## 2018-04-14 ENCOUNTER — Telehealth: Payer: Self-pay | Admitting: Internal Medicine

## 2018-04-14 NOTE — Telephone Encounter (Signed)
Called and spoke with patient, she wanted to know if we had flu vaccines in. Advised patient that we have regular flu vaccines and would be getting the high dose no later than Friday per [facility]. Patient then asked which flu vaccines we carry as far as what components are in them. She wanted to know if we carry the H1N1. Advised patient that we do not. Per Joellen Jersey this is the regular flu vaccines that are suggested to last 6 months or greater. The regular quad. Patient verbalized understanding. Nothing further needed.

## 2018-04-22 ENCOUNTER — Other Ambulatory Visit: Payer: Self-pay | Admitting: Internal Medicine

## 2018-04-23 ENCOUNTER — Telehealth: Payer: Self-pay | Admitting: Internal Medicine

## 2018-04-23 ENCOUNTER — Other Ambulatory Visit: Payer: Self-pay | Admitting: Obstetrics and Gynecology

## 2018-04-23 DIAGNOSIS — Z1231 Encounter for screening mammogram for malignant neoplasm of breast: Secondary | ICD-10-CM

## 2018-04-23 MED ORDER — BUDESONIDE-FORMOTEROL FUMARATE 80-4.5 MCG/ACT IN AERO: INHALATION_SPRAY | RESPIRATORY_TRACT | 0 refills | Status: DC

## 2018-04-23 NOTE — Telephone Encounter (Signed)
Called and spoke to patient. She is overdue for her OV. Scheduled with her ROV with MR on 05/15/18 at 0930. Refill of Symbicort sent to pharmacy. Nothing further needed at this time.

## 2018-05-15 ENCOUNTER — Ambulatory Visit (INDEPENDENT_AMBULATORY_CARE_PROVIDER_SITE_OTHER): Payer: Medicare Other | Admitting: Internal Medicine

## 2018-05-15 ENCOUNTER — Encounter: Payer: Self-pay | Admitting: Internal Medicine

## 2018-05-15 VITALS — BP 123/80 | HR 65 | Ht 64.5 in | Wt 165.0 lb

## 2018-05-15 DIAGNOSIS — J4540 Moderate persistent asthma, uncomplicated: Secondary | ICD-10-CM

## 2018-05-15 LAB — NITRIC OXIDE: NITRIC OXIDE: 31

## 2018-05-15 MED ORDER — BUDESONIDE-FORMOTEROL FUMARATE 80-4.5 MCG/ACT IN AERO: INHALATION_SPRAY | RESPIRATORY_TRACT | 11 refills | Status: DC

## 2018-05-15 NOTE — Patient Instructions (Addendum)
ICD-10-CM   1. Moderate persistent asthma without complication A16.55    Asthma is well controlled  Plan -Glad you are up-to-date with the flu shot and vaccines -Please talk to PCP Derinda Late, MD -  and ensure you get  shingrix (GSK) inactivated vaccine against shingles -Continue Symbicort 2 puff 2 times daily at current dose  -CMA to do 12 refills  Follow-up 1 year or sooner if needed; HCQ and exam nitric oxide testing at follow-up

## 2018-05-15 NOTE — Addendum Note (Signed)
Addended by: Lorretta Harp on: 05/15/2018 09:51 AM   Modules accepted: Orders

## 2018-05-15 NOTE — Progress Notes (Signed)
OV 07/17/2015  Chief Complaint  Patient presents with  . Follow-up    Pt c/o PND, mild cough, and hoarseness x 1-2 weeks. Pt denies CP/tightness and f/c/s. Pt states she thinks some of the s/s are from turning on her heat in her house.    Follow-up moderate persistent asthma. Last seen February 2016. After that she has upped her Symbicort to schedule 2 puff 2 times daily. She wants to do this higher dose through the fall and winter season before readjusting downwards. Generally she likes a minimalist approach. She continues to get her allergy shots and is on antihistamines through Dr. Remus Blake at [place]. Currently overall asthma is under control. 2 weeks ago started having a cold that she picked up from a grandchild. This associated with change in weather resulted in hoarse voice and some yellow mucus a week ago. Symptoms are improving. They did get worse over the Thanksgiving holidays because of gas especially the hoarse voice but she is some better. Nevertheless she does not think the symptoms have descended into the lung and she does not think she is in an asthma exacerbation. In fact 5. asthma control question score is 0 showing excellent control.  Specifically she is not waking up in the night because of asthma. And when she wakes up in the morning she has no asthma symptoms. She is not limited in her activities because of asthma. Does not have shortness of breath because of asthma and does not have any wheezing has not used any albuterol for rescue. fenop  37 ppb  OV 01/16/2016  Chief Complaint  Patient presents with  . Follow-up    Pt reports no problems with her breathing. Pt denies cough/wheeze/CP/tightness.      Moderate persistent asthma follow-up. Last visit was November 2016. Since then she is doing well. Asthma control 5. questionnaire is 0 out of 5 showing excellent control. She does not wake up in the middle of the night because of asthma. When she wakes up symptoms  are absent. She is not limited in her activities because of asthma. She does not experience dyspnea because of asthma. She does not use albuterol because of asthma. She does her gardening and she wants to continue her Symbicort 2 puff 2 times daily. She will occasionally take Flonase when she gardens. She does not want exhaled nitric oxide testing because [facility] denied this and she had been $20 out of pocket..   Acute visit sept 2017  Chief complaint:  Acute visit for asthma exacerbation.  HPI: Mrs. Magdaleno is a 72 year old with moderate persistent asthma. She has been well controlled on Symbicort. She decided to wean herself off of it starting in July of this year. She reduced the number of puffs she took slowly and came completely off it one half weeks ago. Over the past few days she's noticed persistent symptoms, increasing shortness of breath, wheezing, chest tightness, cough with yellow mucus. She has daily nocturnal awakenings and was unable to sleep the whole of last night. She needs to use her albuterol rescue inhaler up to 3 times daily (never needed to use before while on symbicort)    OV 01/22/2017  Chief Complaint  Patient presents with  . Follow-up    Pt states she has a current sinus infection and just completed abx - zpak. Pt states she is improved but not quite back to baseline. Pt denies cough, CP/tightness, chest congestion, and f/c/s.  72 year old female with moderate persistent asthma. She is now back on Symbicort 2 puff 2 times daily. She initially tried to wean this off but could not and ended up with a flareup late 2017. Since then she's had ear infection and currently is battling a sinus infection. She's finished Z-Pak yesterday and this is improving. She does not feel this is affected her asthma. Her asthma is well controlled without any nocturnal symptoms or albuterol use or chest tightness or wheezing. She is interested in the new shingles vaccine but  wants supplies to drop and will wait till 2019  OV 08/05/2017  Chief Complaint  Patient presents with  . Follow-up    Pt states that she has been doing good. States that she has had a couple flare-ups with asthma since last visit. States that she does have mild SOB but denies any current complaints of cough or CP.    72 year old female with moderate persistent asthma.  She continues on Symbicort 80/4.52 puff 2 times daily.  She feels she cannot wean this off because even on this she has up-and-down days.  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  Periodically she will get more congestion and chest tightness depending on the weather.  Nevertheless is all within a band where she does not have to use albuterol for rescue.  She says Symbicort helps her.  Asthma control questionnaire is 0.6 out of 5 showing good control.  She is not waking up in the middle of the night with symptoms.  When she wakes up she is does not have any symptoms.  In the daytime she is very slightly limited because of asthma.  She is experiencing very little shortness of breath and is wheezing hardly any of the time.  Not using albuterol for rescue.  She is up-to-date with her flu shot      OV 05/15/2018  Subjective:  Patient ID: Johny Shears, female , DOB: July 06, 1946 , age 72 y.o. , MRN: [id number] , ADDRESS: [address] [place] [address]   05/15/2018 -   Chief Complaint  Patient presents with  . Follow-up    Pt states she has been doing well since last visit and denies any complaints.     HPI SHYASIA FUNCHES 72 y.o. -returns for asthma follow-up.  52-month visit.  She is up-to-date with her flu shot.  She called in for a refill but was advised by the office to come.  She says she has no problems coming but would at least prefer to have 12 refills given her overall stability.  At this point in time she has no acute complaints.  Asthma  control questionnaire 0 out of 5.  This is on Symbicort.  Exam nitric oxide is 31.  She is not waking up in the middle of the night with asthma when she wakes up she has no symptoms her activities are not limited shortness of breath has no limitations she is not wheezing not using albuterol for rescue       Asthma Control Panel 05/15/2018   Current Med Regimen symbicport  ACQ 5 point- 1 week. wtd avg score. <1.0 is good control 0.75-1.25 is grey zone. >1.25 poor control. Delta 0.5 is clinically meaningful 0  FeNO ppB 31  FeV1    Planned intervention  for visit Continue symbicort     ROS - per HPI  has a past medical history of Anemia, Anxiety, Chronic allergic conjunctivitis, DDD (degenerative disc disease), lumbar, Depression, History of cervical polypectomy, History of concussion, History of uterine fibroid, Moderate persistent asthma without complication, Multiple chemical sensitivity syndrome, Perennial allergic rhinitis with seasonal variation, PMB (postmenopausal bleeding), and Submucous uterine fibroid.   reports that she has never smoked. She has never used smokeless tobacco.  Past Surgical History:  Procedure Laterality Date  . BUNIONECTOMY Right 2009  . CERVICAL CERCLAGE  x2    . COLONOSCOPY WITH PROPOFOL N/A 07/12/2014   Procedure: COLONOSCOPY WITH PROPOFOL;  Surgeon: Garlan Fair, MD;  Location: [facility];  Service: Endoscopy;  Laterality: N/A;  . DILATION AND CURETTAGE OF UTERUS  1973  . DILITATION & CURRETTAGE/HYSTROSCOPY WITH VERSAPOINT RESECTION N/A 03/28/2015   Procedure: DILATATION & CURETTAGE/HYSTEROSCOPY WITH VERSAPOINT RESECTION;  Surgeon: Ena Dawley, MD;  Location: [facility];  Service: Gynecology;  Laterality: N/A;  . NASAL SINUS SURGERY  1989  . TUBAL LIGATION  1981    Allergies  Allergen Reactions  . Amoxicillin Hives, Shortness Of Breath and Itching  . Erythromycin Hives, Shortness Of Breath and Swelling  . Latex Rash  and Other (See Comments)    Skin gets irritated and itchy  . Sulfa Antibiotics Hives, Shortness Of Breath and Swelling  . Keflex [Cephalexin] Nausea And Vomiting    severe  . Penicillins Hives    Childhood reaction     Immunization History  Administered Date(s) Administered  . Influenza Split 07/30/2012, 06/19/2014  . Influenza, High Dose Seasonal PF 04/19/2017, 04/15/2018  . Influenza,inj,Quad PF,6+ Mos 06/07/2013, 06/01/2015, 04/23/2016  . Pneumococcal Conjugate-13 01/12/2015  . Pneumococcal Polysaccharide-23 01/25/2016  . Td 08/20/2010  . Tdap 01/12/2015  . Zoster 02/21/2015    Family History  Problem Relation Age of Onset  . Heart disease Father   . Cancer Mother        abdominal  . Thyroid disease Sister        HYPO THYROID  . Thyroid disease Daughter        HYPO THYROID     Current Outpatient Medications:  .  ALBUTEROL SULFATE IN, Inhale into the lungs. respiclick, Disp: , Rfl:  .  budesonide-formoterol (SYMBICORT) 80-4.5 MCG/ACT inhaler, INHALE 2 PUFFS BY MOUTH TWICE DAILY AS DIRECTED, RINSE AFTER USE, Disp: 10.2 g, Rfl: 0 .  CALCIUM CITRATE PO, Take 500 mg by mouth daily., Disp: , Rfl:  .  Cholecalciferol (VITAMIN D3) 1000 UNITS CAPS, Take 1 capsule by mouth daily., Disp: , Rfl:  .  fexofenadine-pseudoephedrine (ALLEGRA-D 24) 180-240 MG per 24 hr tablet, Take 1 tablet by mouth every morning., Disp: , Rfl:  .  fluticasone (FLONASE) 50 MCG/ACT nasal spray, Place 2 sprays into both nostrils daily as needed for allergies or rhinitis., Disp: , Rfl:       Objective:   Vitals:   05/15/18 0928  BP: 123/80  Pulse: 65  SpO2: 94%  Weight: 165 lb (74.8 kg)  Height: 5' 4.5" (1.638 m)    Estimated body mass index is 27.88 kg/m as calculated from the following:   Height as of this encounter: 5' 4.5" (1.638 m).   Weight as of this encounter: 165 lb (74.8 kg).  @WEIGHTCHANGE @  [employer]   05/15/18 0928  Weight: 165 lb (74.8 kg)     Physical Exam  General  Appearance:    Alert, cooperative, no distress, appears stated age - yes , Deconditioned looking - no , OBESE  - no,  Sitting on Wheelchair -  no  Head:    Normocephalic, without obvious abnormality, atraumatic  Eyes:    PERRL, conjunctiva/corneas clear,  Ears:    Normal TM's and external ear canals, both ears  Nose:   Nares normal, septum midline, mucosa normal, no drainage    or sinus tenderness. OXYGEN ON  - no . Patient is @ ra   Throat:   Lips, mucosa, and tongue normal; teeth and gums normal. Cyanosis on lips - no  Neck:   Supple, symmetrical, trachea midline, no adenopathy;    thyroid:  no enlargement/tenderness/nodules; no carotid   bruit or JVD  Back:     Symmetric, no curvature, ROM normal, no CVA tenderness  Lungs:     Distress - no , Wheeze no, Barrell Chest - no, Purse lip breathing - no, Crackles - no   Chest Wall:    No tenderness or deformity.    Heart:    Regular rate and rhythm, S1 and S2 normal, no rub   or gallop, Murmur - no  Breast Exam:    NOT DONE  Abdomen:     Soft, non-tender, bowel sounds active all four quadrants,    no masses, no organomegaly. Visceral obesity - no  Genitalia:   NOT DONE  Rectal:   NOT DONE  Extremities:   Extremities - normal, Has Cane - no, Clubbing - no, Edema - no  Pulses:   2+ and symmetric all extremities  Skin:   Stigmata of Connective Tissue Disease - no  Lymph nodes:   Cervical, supraclavicular, and axillary nodes normal  Psychiatric:  Neurologic:   Pleasant - yes, Anxious - no, Flat affect - no  CAm-ICU - neg, Alert and Oriented x 3 - yes, Moves all 4s - yes, Speech - normal, Cognition - intact           Assessment:       ICD-10-CM   1. Moderate persistent asthma without complication M27.07        Plan:     Patient Instructions     ICD-10-CM   1. Moderate persistent asthma without complication E67.54    Asthma is well controlled  Plan -Glad you are up-to-date with the flu shot and vaccines -Please talk to PCP  Derinda Late, MD -  and ensure you get  shingrix ([place]) inactivated vaccine against shingles -Continue Symbicort 2 puff 2 times daily at current dose  -CMA to do 12 refills  Follow-up 1 year or sooner if needed; HCQ and exam nitric oxide testing at follow-up     SIGNATURE    Dr. Brand Males, M.D., F.C.C.P,  Pulmonary and Critical Care Medicine Staff Physician, [place] Director - Interstitial Lung Disease  Program  [facility] [place], [place], [address]  Pager: [phone], If no answer or between  15:00h - 7:00h: call [phone] Telephone: [phone]  9:46 AM 05/15/2018

## 2018-07-20 ENCOUNTER — Other Ambulatory Visit: Payer: Self-pay | Admitting: Family Medicine

## 2018-07-20 DIAGNOSIS — Z13820 Encounter for screening for osteoporosis: Secondary | ICD-10-CM

## 2018-08-07 ENCOUNTER — Ambulatory Visit: Payer: Medicare Other

## 2018-08-18 ENCOUNTER — Ambulatory Visit (INDEPENDENT_AMBULATORY_CARE_PROVIDER_SITE_OTHER): Payer: Medicare Other | Admitting: Orthopedic Surgery

## 2018-08-18 ENCOUNTER — Encounter (INDEPENDENT_AMBULATORY_CARE_PROVIDER_SITE_OTHER): Payer: Self-pay | Admitting: Orthopedic Surgery

## 2018-08-18 ENCOUNTER — Ambulatory Visit (INDEPENDENT_AMBULATORY_CARE_PROVIDER_SITE_OTHER): Payer: Medicare Other

## 2018-08-18 VITALS — Ht 64.0 in | Wt 165.0 lb

## 2018-08-18 DIAGNOSIS — M76821 Posterior tibial tendinitis, right leg: Secondary | ICD-10-CM | POA: Diagnosis not present

## 2018-08-18 DIAGNOSIS — M6701 Short Achilles tendon (acquired), right ankle: Secondary | ICD-10-CM | POA: Diagnosis not present

## 2018-08-18 DIAGNOSIS — M79671 Pain in right foot: Secondary | ICD-10-CM

## 2018-08-19 ENCOUNTER — Encounter (INDEPENDENT_AMBULATORY_CARE_PROVIDER_SITE_OTHER): Payer: Self-pay | Admitting: Orthopedic Surgery

## 2018-08-19 NOTE — Progress Notes (Signed)
Office Visit Note   Patient: Catherine Chambers           Date of Birth: [birth year]           MRN: [id number] Visit Date: 08/18/2018              Requested by: Derinda Late, MD [address] [place], [place] [address] PCP: Derinda Late, MD  Chief Complaint  Patient presents with  . Right Foot - Pain    Hx take down nonunion revision ORIF right GT      HPI: Patient is a 73 year old woman who is status post bunion reconstruction in 2008 of the right great toe.  Patient states that recently she has noticed increasing valgus alignment and loss of arch to the right foot she states that with all increased activities she has sharp pain across the metatarsal heads.  Also has some medial ankle pain along the posterior tibial tendon which is worse with activities.  Assessment & Plan: Visit Diagnoses:  1. Pain in right foot   2. Posterior tibial tendinitis, right leg   3. Achilles tendon contracture, right     Plan: Recommended Achilles stretching, Achilles stretching was demonstrated to do 5 times a day a minute at a time.  Hoka sneakers and sole orthotics.  Discussed that if she fails conservative treatment options could include a talonavicular and subtalar fusion for the posterior tibial tendon insufficiency possible gastrocnemius recession for the Achilles contracture and a possible Weil osteotomy for the long second metatarsal.  Follow-Up Instructions: Return in about 4 weeks (around 09/15/2018).   Ortho Exam  Patient is alert, oriented, no adenopathy, well-dressed, normal affect, normal respiratory effort. Examination patient has a good dorsalis pedis pulse she has posterior tibial tendon insufficiency worse on the right than the left with a positive too many toe sign on the right she cannot do a single limb heel raise she has tenderness to palpation along the posterior tibial tendon on the right.  She has some tenderness to palpation at the accessory navicular.  Patient is  point tender to palpation beneath the second metatarsal head with radiographs showing a long second metatarsal.  She does have Achilles tendon contracture worse on the right than the left with dorsiflexion 10 degrees short of neutral.  Imaging: Xr Foot Complete Right  Result Date: 08/19/2018 3 view radiographs of the right foot shows stable alignment of the proximal osteotomy bunion surgery on the right patient does have some shortening of the first metatarsal with a long second metatarsal.  There is also an accessory navicular.  No stress fractures.  No images are attached to the encounter.  Labs: No results found for: HGBA1C, ESRSEDRATE, CRP, LABURIC, REPTSTATUS, GRAMSTAIN, CULT, LABORGA   Lab Results  Component Value Date   ALBUMIN 4.4 01/03/2011    Body mass index is 28.32 kg/m.  Orders:  Orders Placed This Encounter  Procedures  . XR Foot Complete Right   No orders of the defined types were placed in this encounter.    Procedures: No procedures performed  Clinical Data: No additional findings.  ROS:  All other systems negative, except as noted in the HPI. Review of Systems  Objective: Vital Signs: Ht 5\' 4"  (1.626 m)   Wt 165 lb (74.8 kg)   BMI 28.32 kg/m   Specialty Comments:  No specialty comments available.  PMFS History: Patient Active Problem List   Diagnosis Date Noted  . Moderate persistent asthma 04/15/2013  . Asthma, allergic  01/30/2011  . Chest pain 01/16/2011   Past Medical History:  Diagnosis Date  . Anemia   . Anxiety   . Chronic allergic conjunctivitis   . DDD (degenerative disc disease), lumbar   . Depression   . History of cervical polypectomy    ENDOCERVICAL  . History of concussion    1985-- no residual  . History of uterine fibroid   . Moderate persistent asthma without complication    PULMOLOGIST-  DR RAMASWAMY  . Multiple chemical sensitivity syndrome   . Perennial allergic rhinitis with seasonal variation   . PMB  (postmenopausal bleeding)   . Submucous uterine fibroid     Family History  Problem Relation Age of Onset  . Heart disease Father   . Cancer Mother        abdominal  . Thyroid disease Sister        HYPO THYROID  . Thyroid disease Daughter        HYPO THYROID    Past Surgical History:  Procedure Laterality Date  . BUNIONECTOMY Right 2009  . CERVICAL CERCLAGE  x2    . COLONOSCOPY WITH PROPOFOL N/A 07/12/2014   Procedure: COLONOSCOPY WITH PROPOFOL;  Surgeon: Garlan Fair, MD;  Location: [facility];  Service: Endoscopy;  Laterality: N/A;  . DILATION AND CURETTAGE OF UTERUS  1973  . DILITATION & CURRETTAGE/HYSTROSCOPY WITH VERSAPOINT RESECTION N/A 03/28/2015   Procedure: DILATATION & CURETTAGE/HYSTEROSCOPY WITH VERSAPOINT RESECTION;  Surgeon: Ena Dawley, MD;  Location: [facility];  Service: Gynecology;  Laterality: N/A;  . NASAL SINUS SURGERY  1989  . TUBAL LIGATION  1981   Social History   Occupational History  . Occupation: retired    Fish farm manager: RETIRED  Tobacco Use  . Smoking status: Never Smoker  . Smokeless tobacco: Never Used  Substance and Sexual Activity  . Alcohol use: No  . Drug use: No  . Sexual activity: Yes    Birth control/protection: Surgical    Comment: btl

## 2018-09-17 ENCOUNTER — Ambulatory Visit
Admission: RE | Admit: 2018-09-17 | Discharge: 2018-09-17 | Disposition: A | Discharge status: Home / Self Care | Payer: Medicare Other | Source: Ambulatory Visit | Attending: Obstetrics and Gynecology | Admitting: Obstetrics and Gynecology

## 2018-09-17 ENCOUNTER — Ambulatory Visit
Admission: RE | Admit: 2018-09-17 | Discharge: 2018-09-17 | Disposition: A | Discharge status: Home / Self Care | Payer: Medicare Other | Source: Ambulatory Visit | Attending: Family Medicine | Admitting: Family Medicine

## 2018-09-17 DIAGNOSIS — Z13820 Encounter for screening for osteoporosis: Secondary | ICD-10-CM

## 2018-09-17 DIAGNOSIS — Z1231 Encounter for screening mammogram for malignant neoplasm of breast: Secondary | ICD-10-CM

## 2019-05-06 ENCOUNTER — Other Ambulatory Visit: Payer: Self-pay | Admitting: Internal Medicine

## 2019-05-18 ENCOUNTER — Telehealth (INDEPENDENT_AMBULATORY_CARE_PROVIDER_SITE_OTHER): Payer: Medicare Other | Admitting: Primary Care

## 2019-05-18 ENCOUNTER — Telehealth: Payer: Self-pay | Admitting: Primary Care

## 2019-05-18 ENCOUNTER — Encounter: Payer: Self-pay | Admitting: Primary Care

## 2019-05-18 DIAGNOSIS — J4540 Moderate persistent asthma, uncomplicated: Secondary | ICD-10-CM

## 2019-05-18 MED ORDER — ALBUTEROL SULFATE HFA 108 (90 BASE) MCG/ACT IN AERS: 2.0000 | INHALATION_SPRAY | Freq: Four times a day (QID) | RESPIRATORY_TRACT | 3 refills | Status: DC | PRN

## 2019-05-18 MED ORDER — BUDESONIDE-FORMOTEROL FUMARATE 80-4.5 MCG/ACT IN AERO: INHALATION_SPRAY | RESPIRATORY_TRACT | 3 refills | Status: DC

## 2019-05-18 MED ORDER — MONTELUKAST SODIUM 10 MG PO TABS: 10.0000 mg | ORAL_TABLET | Freq: Every day | ORAL | 3 refills | Status: DC

## 2019-05-18 NOTE — Patient Instructions (Signed)
Rx: Singulair 10mg  at bedtime  Refill Symbicort 80  Refill Albuterol hfa   Recommendations: Continue Symbicort 2 puffs twice daily (trial tapering to 1 puff twice daily if asthma symptoms are well controlled) Continue Albuterol rescue inhaler 2 puffs every 6 hours as needed for breakthrough shortness of breath  Continue Allegra allergy medication daily  Start Singulair (montelukast) daily  Follow-up: 6 months with Dr. Chase Caller or Eustaquio Maize NP (televisit or in office if comfortable )  Asthma, Adult  Asthma is a long-term (chronic) condition in which the airways get tight and narrow. The airways are the breathing passages that lead from the nose and mouth down into the lungs. A person with asthma will have times when symptoms get worse. These are called asthma attacks. They can cause coughing, whistling sounds when you breathe (wheezing), shortness of breath, and chest pain. They can make it hard to breathe. There is no cure for asthma, but medicines and lifestyle changes can help control it. There are many things that can bring on an asthma attack or make asthma symptoms worse (triggers). Common triggers include:  Mold.  Dust.  Cigarette smoke.  Cockroaches.  Things that can cause allergy symptoms (allergens). These include animal skin flakes (dander) and pollen from trees or grass.  Things that pollute the air. These may include household cleaners, wood smoke, smog, or chemical odors.  Cold air, weather changes, and wind.  Crying or laughing hard.  Stress.  Certain medicines or drugs.  Certain foods such as dried fruit, potato chips, and grape juice.  Infections, such as a cold or the flu.  Certain medical conditions or diseases.  Exercise or tiring activities. Asthma may be treated with medicines and by staying away from the things that cause asthma attacks. Types of medicines may include:  Controller medicines. These help prevent asthma symptoms. They are usually taken  every day.  Fast-acting reliever or rescue medicines. These quickly relieve asthma symptoms. They are used as needed and provide short-term relief.  Allergy medicines if your attacks are brought on by allergens.  Medicines to help control the body's defense (immune) system. Follow these instructions at home: Avoiding triggers in your home  Change your heating and air conditioning filter often.  Limit your use of fireplaces and wood stoves.  Get rid of pests (such as roaches and mice) and their droppings.  Throw away plants if you see mold on them.  Clean your floors. Dust regularly. Use cleaning products that do not smell.  Have someone vacuum when you are not home. Use a vacuum cleaner with a HEPA filter if possible.  Replace carpet with wood, tile, or vinyl flooring. Carpet can trap animal skin flakes and dust.  Use allergy-proof pillows, mattress covers, and box spring covers.  Wash bed sheets and blankets every week in hot water. Dry them in a dryer.  Keep your bedroom free of any triggers.  Avoid pets and keep windows closed when things that cause allergy symptoms are in the air.  Use blankets that are made of polyester or cotton.  Clean bathrooms and kitchens with bleach. If possible, have someone repaint the walls in these rooms with mold-resistant paint. Keep out of the rooms that are being cleaned and painted.  Wash your hands often with soap and water. If soap and water are not available, use hand sanitizer.  Do not allow anyone to smoke in your home. General instructions  Take over-the-counter and prescription medicines only as told by your doctor. ? Talk  with your doctor if you have questions about how or when to take your medicines. ? Make note if you need to use your medicines more often than usual.  Do not use any products that contain nicotine or tobacco, such as cigarettes and e-cigarettes. If you need help quitting, ask your doctor.  Stay away from  secondhand smoke.  Avoid doing things outdoors when allergen counts are high and when air quality is low.  Wear a ski mask when doing outdoor activities in the winter. The mask should cover your nose and mouth. Exercise indoors on cold days if you can.  Warm up before you exercise. Take time to cool down after exercise.  Use a peak flow meter as told by your doctor. A peak flow meter is a tool that measures how well the lungs are working.  Keep track of the peak flow meter's readings. Write them down.  Follow your asthma action plan. This is a written plan for taking care of your asthma and treating your attacks.  Make sure you get all the shots (vaccines) that your doctor recommends. Ask your doctor about a flu shot and a pneumonia shot.  Keep all follow-up visits as told by your doctor. This is important. Contact a doctor if:  You have wheezing, shortness of breath, or a cough even while taking medicine to prevent attacks.  The mucus you cough up (sputum) is thicker than usual.  The mucus you cough up changes from clear or white to yellow, green, gray, or bloody.  You have problems from the medicine you are taking, such as: ? A rash. ? Itching. ? Swelling. ? Trouble breathing.  You need reliever medicines more than 2-3 times a week.  Your peak flow reading is still at 50-79% of your personal best after following the action plan for 1 hour.  You have a fever. Get help right away if:  You seem to be worse and are not responding to medicine during an asthma attack.  You are short of breath even at rest.  You get short of breath when doing very little activity.  You have trouble eating, drinking, or talking.  You have chest pain or tightness.  You have a fast heartbeat.  Your lips or fingernails start to turn blue.  You are light-headed or dizzy, or you faint.  Your peak flow is less than 50% of your personal best.  You feel too tired to breathe  normally. Summary  Asthma is a long-term (chronic) condition in which the airways get tight and narrow. An asthma attack can make it hard to breathe.  Asthma cannot be cured, but medicines and lifestyle changes can help control it.  Make sure you understand how to avoid triggers and how and when to use your medicines. This information is not intended to replace advice given to you by your health care provider. Make sure you discuss any questions you have with your health care provider. Document Released: 01/22/2008 Document Revised: 10/08/2018 Document Reviewed: 09/09/2016 Elsevier Patient Education  [place].   Montelukast chewable tablets What is this medicine? MONTELUKAST (mon te LOO kast) is used to prevent and treat the symptoms of asthma. It is also used to treat allergies. Do not use for an acute asthma attack. This medicine may be used for other purposes; ask your health care provider or pharmacist if you have questions. COMMON BRAND NAME(S): Singulair What should I tell my health care provider before I take this medicine? They need  to know if you have any of these conditions:  liver disease  phenylketonuria  an unusual or allergic reaction to montelukast, other medicines, foods, dyes, or preservatives  pregnant or trying to get pregnant  breast-feeding How should I use this medicine? Take this medicine by mouth with a glass of water. Chew it completely before swallowing. Follow the directions on the prescription label. If you have asthma, take this medicine once a day in the evening. If you have allergies, take this medicine once a day, at about the same time each day. You may take this medicine with or without food. Take your medicine at regular intervals. Do not take it more often than directed. Do not stop taking except on your doctor's advice. Talk to your pediatrician regarding the use of this medicine in children. While this drug may be prescribed for children  as young as 24 years of age, precautions do apply. Overdosage: If you think you have taken too much of this medicine contact a poison control center or emergency room at once. NOTE: This medicine is only for you. Do not share this medicine with others. What if I miss a dose? If you miss a dose, skip it. Take your next dose at the normal time. Do not take extra or 2 doses at the same time to make up for the missed dose. What may interact with this medicine?  anti-infectives like rifampin and rifabutin  medicines for seizures like phenytoin, phenobarbital, and carbamazepine This list may not describe all possible interactions. Give your health care provider a list of all the medicines, herbs, non-prescription drugs, or dietary supplements you use. Also tell them if you smoke, drink alcohol, or use illegal drugs. Some items may interact with your medicine. What should I watch for while using this medicine? Visit your doctor or health care professional for regular checks on your progress. Tell your doctor or health care professional if your allergy or asthma symptoms do not improve. Take your medicine even when you do not have symptoms. Do not stop taking any of your medicine(s) unless your doctor tells you to. If you have asthma, talk to your doctor about what to do in an acute asthma attack. Always have your inhaled rescue medicine for asthma attacks with you. Patients and their families should watch for new or worsening thoughts of suicide or depression. Also watch for sudden changes in feelings such as feeling anxious, agitated, panicky, irritable, hostile, aggressive, impulsive, severely restless, overly excited and hyperactive, or not being able to sleep. Any worsening of mood or thoughts of suicide or dying should be reported to your health care professional right away. What side effects may I notice from receiving this medicine? Side effects that you should report to your doctor or health care  professional as soon as possible:  allergic reactions like skin rash or hives, or swelling of the face, lips, or tongue  breathing problems  changes in emotions or moods  confusion  depressed mood  fever or infection  hallucinations  joint pain  painful lumps under the skin  pain, tingling, numbness in the hands or feet  redness, blistering, peeling, or loosening of the skin, including inside the mouth  restlessness  seizures  sleep walking  signs and symptoms of infection like fever; chills; cough; sore throat; flu-like illness  signs and symptoms of liver injury like dark yellow or brown urine; general ill feeling or flu-like symptoms; light-colored stools; loss of appetite; nausea; right upper belly pain; unusually  weak or tired; yellowing of the eyes or skin  sinus pain or swelling  stuttering  suicidal thoughts or other mood changes  tremors  trouble sleeping  uncontrolled muscle movements  unusual bleeding or bruising  vivid or bad dreams Side effects that usually do not require medical attention (report to your doctor or health care professional if they continue or are bothersome):  dizziness  drowsiness  headache  runny nose  stomach upset  tiredness This list may not describe all possible side effects. Call your doctor for medical advice about side effects. You may report side effects to FDA at 1-800-FDA-1088. Where should I keep my medicine? Keep out of the reach of children. Store at a room temperature between 15 and 30 degrees C (59 and 86 degrees F). Protect from light and moisture. Keep this medicine in the original bottle. Throw away any unused medicine after the expiration date. NOTE: This sheet is a summary. It may not cover all possible information. If you have questions about this medicine, talk to your doctor, pharmacist, or health care provider.  2020 Elsevier/Gold Standard (2018-12-04 12:57:44)

## 2019-05-18 NOTE — Telephone Encounter (Signed)
Call returned to patient, confirmed DOB, inquired as to whether she has contacted the pharmacy. I made her aware the medications have been sent to her preferred pharmacy. I made her aware the medication has been sent and encouraged her to call the pharmacy first in the future. Voiced understanding. Nothing further needed at this time.

## 2019-05-18 NOTE — Progress Notes (Signed)
Virtual Visit via Video Note  I connected with Catherine Chambers on 05/18/19 at  9:00 AM EDT by a video enabled telemedicine application and verified that I am speaking with the correct person using two identifiers.  Location: Patient: Home Provider: Office   I discussed the limitations of evaluation and management by telemedicine and the availability of in person appointments. The patient expressed understanding and agreed to proceed.  History of Present Illness: 73 year old female, never smoked. PMH significant for moderate persistent asthma. Patient of Dr. Chase Caller, last seen on 05/15/2018.   05/18/2019 Patient contacted today by video visit for 1 year follow-up/asthma. States that she has her good and bad days, currently feeling well. No recent asthma exacerbations requiring oral steriods. States that she had a few issues related to her allergies. States that she knows how to handle it and isolates herself after allergen exposure. She works outside in the yard a lot. Continues Allegra daily. Continues Symbicort 80 twice daily. She does not require her rescue inhaler often but has it on hand. Her goal is to come off steroid inhaler eventually if able. Received high dose flu vaccine 3 weeks ago from [facility].    Observations/Objective:  - No significant shortness of breath, wheezing or cough  Testing: 2012 PFT - FVC 2.85 (111%), FEV1 1.33 (66%), ratio 47 +BD response   2016 FENO- 37  Assessment and Plan:  Allergic asthma  - Asthma symptoms well controlled on current therapy - Rare SABA use - Patient's goal is to come of steroid inhalers  - Trial decreasing Symbicort 80 to 1 puff twice daily - Refill Albuterol hfa to have on hand if needed  - Add Singulair 10mg  at bedtime  - Continue Allegra OTC allergy medication  - Up to date with flu vaccine  - Unable to do FENO d/t covid   Follow Up Instructions:   6 month follow-up   I discussed the assessment and treatment plan with  the patient. The patient was provided an opportunity to ask questions and all were answered. The patient agreed with the plan and demonstrated an understanding of the instructions.   The patient was advised to call back or seek an in-person evaluation if the symptoms worsen or if the condition fails to improve as anticipated.  I provided 18 minutes of non-face-to-face time during this encounter.   Martyn Ehrich, NP

## 2019-06-03 ENCOUNTER — Telehealth: Payer: Self-pay | Admitting: Primary Care

## 2019-06-03 MED ORDER — ADVAIR HFA 115-21 MCG/ACT IN AERO: 2.0000 | INHALATION_SPRAY | Freq: Two times a day (BID) | RESPIRATORY_TRACT | 6 refills | Status: DC

## 2019-06-03 NOTE — Telephone Encounter (Signed)
Symbicort 80 not preferred on patient insurance. Changed to Advair [facility]

## 2019-06-04 MED ORDER — BUDESONIDE-FORMOTEROL FUMARATE 80-4.5 MCG/ACT IN AERO: INHALATION_SPRAY | RESPIRATORY_TRACT | 3 refills | Status: DC

## 2019-06-04 NOTE — Telephone Encounter (Signed)
Spoke with patient. She was upset that her medication was changed without her knowing. She stated that she had called [employer] yesterday twice and was informed that the Symbicort 80 is covered. She then went to [facility] and was told that the Symbicort had been changed to Advair. She wishes to have the RX for Advair cancelled.   Reviewed patient's formulary, Symbicort 80 is listed as a preferred medication as well. Spoke with pharmacist, Symbicort is covered with no charge. RX for Advair has been cancelled. Resent RX for Symbicort.   Patient is aware.   Will route to [facility] so she is aware.

## 2019-06-04 NOTE — Telephone Encounter (Signed)
Ok to change back to Symbicort. I would explain that we received notification by fax from [facility] that it was not covered and was given a list of preferred medication. Easy fix, cancel Advair and send in Symbicort 80.

## 2019-06-04 NOTE — Telephone Encounter (Signed)
Pt calling about change in medication.  [phone] or [phone].

## 2019-10-19 ENCOUNTER — Other Ambulatory Visit: Payer: Self-pay | Admitting: Family Medicine

## 2019-10-19 DIAGNOSIS — Z1231 Encounter for screening mammogram for malignant neoplasm of breast: Secondary | ICD-10-CM

## 2019-11-01 ENCOUNTER — Ambulatory Visit (INDEPENDENT_AMBULATORY_CARE_PROVIDER_SITE_OTHER): Payer: Medicare Other | Admitting: Internal Medicine

## 2019-11-01 ENCOUNTER — Other Ambulatory Visit: Payer: Self-pay

## 2019-11-01 DIAGNOSIS — Z7189 Other specified counseling: Secondary | ICD-10-CM

## 2019-11-01 DIAGNOSIS — J4540 Moderate persistent asthma, uncomplicated: Secondary | ICD-10-CM | POA: Diagnosis not present

## 2019-11-01 DIAGNOSIS — Z7185 Encounter for immunization safety counseling: Secondary | ICD-10-CM

## 2019-11-01 NOTE — Patient Instructions (Addendum)
Vaccine counseling  - no allergy to flu and other vaccine but oral antibiotics there is hives and rash  - overall risk is low for anaphylaxis from covid vaccine but you are expected to have side effects  - get yourself observed for longer time at [facility] vaccination site -= Ultimately decision is yours  Moderate persistent asthma without complication  - stable without flare up  - dong well with symbicort  Plan  - continue symbicort scheduled with albuterol as neeed  Followup  6 months face to face  - can come to [facility] if needed

## 2019-11-01 NOTE — Progress Notes (Addendum)
OV 07/17/2015  Chief Complaint  Patient presents with  . Follow-up    Pt c/o PND, mild cough, and hoarseness x 1-2 weeks. Pt denies CP/tightness and f/c/s. Pt states she thinks some of the s/s are from turning on her heat in her house.    Follow-up moderate persistent asthma. Last seen February 2016. After that she has upped her Symbicort to schedule 2 puff 2 times daily. She wants to do this higher dose through the fall and winter season before readjusting downwards. Generally she likes a minimalist approach. She continues to get her allergy shots and is on antihistamines through Dr. Remus Blake at [place]. Currently overall asthma is under control. 2 weeks ago started having a cold that she picked up from a grandchild. This associated with change in weather resulted in hoarse voice and some yellow mucus a week ago. Symptoms are improving. They did get worse over the Thanksgiving holidays because of gas especially the hoarse voice but she is some better. Nevertheless she does not think the symptoms have descended into the lung and she does not think she is in an asthma exacerbation. In fact 5. asthma control question score is 0 showing excellent control.  Specifically she is not waking up in the night because of asthma. And when she wakes up in the morning she has no asthma symptoms. She is not limited in her activities because of asthma. Does not have shortness of breath because of asthma and does not have any wheezing has not used any albuterol for rescue. fenop  37 ppb  OV 01/16/2016  Chief Complaint  Patient presents with  . Follow-up    Pt reports no problems with her breathing. Pt denies cough/wheeze/CP/tightness.      Moderate persistent asthma follow-up. Last visit was November 2016. Since then she is doing well. Asthma control 5. questionnaire is 0 out of 5 showing excellent control. She does not wake up in the middle of the night because of asthma. When she wakes up symptoms  are absent. She is not limited in her activities because of asthma. She does not experience dyspnea because of asthma. She does not use albuterol because of asthma. She does her gardening and she wants to continue her Symbicort 2 puff 2 times daily. She will occasionally take Flonase when she gardens. She does not want exhaled nitric oxide testing because [facility] denied this and she had been $20 out of pocket..   Acute visit sept 2017  Chief complaint:  Acute visit for asthma exacerbation.  HPI: Mrs. Magdaleno is a 74 year old with moderate persistent asthma. She has been well controlled on Symbicort. She decided to wean herself off of it starting in July of this year. She reduced the number of puffs she took slowly and came completely off it one half weeks ago. Over the past few days she's noticed persistent symptoms, increasing shortness of breath, wheezing, chest tightness, cough with yellow mucus. She has daily nocturnal awakenings and was unable to sleep the whole of last night. She needs to use her albuterol rescue inhaler up to 3 times daily (never needed to use before while on symbicort)    OV 01/22/2017  Chief Complaint  Patient presents with  . Follow-up    Pt states she has a current sinus infection and just completed abx - zpak. Pt states she is improved but not quite back to baseline. Pt denies cough, CP/tightness, chest congestion, and f/c/s.  74 year old female with moderate persistent asthma. She is now back on Symbicort 2 puff 2 times daily. She initially tried to wean this off but could not and ended up with a flareup late 2017. Since then she's had ear infection and currently is battling a sinus infection. She's finished Z-Pak yesterday and this is improving. She does not feel this is affected her asthma. Her asthma is well controlled without any nocturnal symptoms or albuterol use or chest tightness or wheezing. She is interested in the new shingles vaccine but  wants supplies to drop and will wait till 2019  OV 08/05/2017  Chief Complaint  Patient presents with  . Follow-up    Pt states that she has been doing good. States that she has had a couple flare-ups with asthma since last visit. States that she does have mild SOB but denies any current complaints of cough or CP.    74 year old female with moderate persistent asthma.  She continues on Symbicort 80/4.52 puff 2 times daily.  She feels she cannot wean this off because even on this she has up-and-down days.  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  Periodically she will get more congestion and chest tightness depending on the weather.  Nevertheless is all within a band where she does not have to use albuterol for rescue.  She says Symbicort helps her.  Asthma control questionnaire is 0.6 out of 5 showing good control.  She is not waking up in the middle of the night with symptoms.  When she wakes up she is does not have any symptoms.  In the daytime she is very slightly limited because of asthma.  She is experiencing very little shortness of breath and is wheezing hardly any of the time.  Not using albuterol for rescue.  She is up-to-date with her flu shot      OV 05/15/2018  Subjective:  Patient ID: Johny Shears, female , DOB: 09-05-1945 , age 74 y.o. , MRN: [id number] , ADDRESS: [address] [place] [address]   05/15/2018 -   Chief Complaint  Patient presents with  . Follow-up    Pt states she has been doing well since last visit and denies any complaints.     HPI GOPI GOODNOW 74 y.o. -returns for asthma follow-up.  21-month visit.  She is up-to-date with her flu shot.  She called in for a refill but was advised by the office to come.  She says she has no problems coming but would at least prefer to have 12 refills given her overall stability.  At this point in time she has no acute complaints.  Asthma  control questionnaire 0 out of 5.  This is on Symbicort.  Exam nitric oxide is 31.  She is not waking up in the middle of the night with asthma when she wakes up she has no symptoms her activities are not limited shortness of breath has no limitations she is not wheezing not using albuterol for rescue        OV 11/01/2019-telephone visit.  Risks, benefits and limitations of telephone visit explained.  Patient verbalized understanding.  Identified by 2 person identifier.  Type of visit: Telephone/Video Circumstance: COVID-19 national emergency Identification of patient Chimere Wilmeth with May 25, 1946 and MRN [id number] - 2 person identifier Risks: Risks, benefits, limitations of telephone visit explained. Patient understood and verbalized agreement to proceed Anyone else  on call: none Patient location: home This provider location: [address], [facility]    Subjective:  Patient ID: Migdalia Saleeby, female , DOB: 10/20/1945 , age 32 y.o. , MRN: [id number] , ADDRESS: [place] [address]   11/01/2019 -  Asthma follow-up.  HPI Raianna Novotney 74 y.o. -  Asthma: Doing well on Symbicort using albuterol as needed.  Appears well controlled.  Has been isolating well in social distancing in the pandemic  Vaccine counseling: She is going to get [employer] vaccine next week at the [facility] site in [place].  She is worried about side effect and anaphylaxis risk.  She asked me to review this.  She has had childhood history of allergies to different oral antibiotics and these are documented below.  She does not remember if these are injectables.  But she thought she had flu shot allergy but then she started trying the flu shot did not have any problems.  Last 4 years she is got several injectable vaccines without any issues.   Asthma Control Panel 05/15/2018   Current Med Regimen symbicport  ACQ 5 point- 1 week. wtd avg score. <1.0 is good  control 0.75-1.25 is grey zone. >1.25 poor control. Delta 0.5 is clinically meaningful 0  FeNO ppB 31  FeV1    Planned intervention  for visit Continue symbicort    ROS - per HPI     has a past medical history of Anemia, Anxiety, Chronic allergic conjunctivitis, DDD (degenerative disc disease), lumbar, Depression, History of cervical polypectomy, History of concussion, History of uterine fibroid, Moderate persistent asthma without complication, Multiple chemical sensitivity syndrome, Perennial allergic rhinitis with seasonal variation, PMB (postmenopausal bleeding), and Submucous uterine fibroid.   reports that she has never smoked. She has never used smokeless tobacco.  Past Surgical History:  Procedure Laterality Date  . BUNIONECTOMY Right 2009  . CERVICAL CERCLAGE  x2    . COLONOSCOPY WITH PROPOFOL N/A 07/12/2014   Procedure: COLONOSCOPY WITH PROPOFOL;  Surgeon: Garlan Fair, MD;  Location: [facility];  Service: Endoscopy;  Laterality: N/A;  . DILATION AND CURETTAGE OF UTERUS  1973  . DILITATION & CURRETTAGE/HYSTROSCOPY WITH VERSAPOINT RESECTION N/A 03/28/2015   Procedure: DILATATION & CURETTAGE/HYSTEROSCOPY WITH VERSAPOINT RESECTION;  Surgeon: Ena Dawley, MD;  Location: [facility];  Service: Gynecology;  Laterality: N/A;  . NASAL SINUS SURGERY  1989  . TUBAL LIGATION  1981    Allergies  Allergen Reactions  . Amoxicillin Hives, Shortness Of Breath and Itching  . Erythromycin Hives, Shortness Of Breath and Swelling  . Latex Rash and Other (See Comments)    Skin gets irritated and itchy  . Sulfa Antibiotics Hives, Shortness Of Breath and Swelling  . Keflex [Cephalexin] Nausea And Vomiting    severe  . Penicillins Hives    Childhood reaction     Immunization History  Administered Date(s) Administered  . Influenza Split 07/30/2012, 06/19/2014  . Influenza, High Dose Seasonal PF 04/19/2017, 04/15/2018, 04/20/2019  . Influenza,inj,Quad PF,6+ Mos  06/07/2013, 06/01/2015, 04/23/2016  . Pneumococcal Conjugate-13 01/12/2015  . Pneumococcal Polysaccharide-23 01/25/2016  . Td 08/20/2010  . Tdap 01/12/2015  . Zoster 02/21/2015    Family History  Problem Relation Age of Onset  . Heart disease Father   . Cancer Mother        abdominal  . Thyroid disease Sister        HYPO THYROID  . Thyroid disease Daughter  HYPO THYROID     Current Outpatient Medications:  .  albuterol (VENTOLIN HFA) 108 (90 Base) MCG/ACT inhaler, Inhale 2 puffs into the lungs every 6 (six) hours as needed., Disp: 6.7 g, Rfl: 3 .  budesonide-formoterol (SYMBICORT) 80-4.5 MCG/ACT inhaler, INHALE 2 PUFFS BY MOUTH TWICE DAILY AS DIRECTED, RINSE AFTER USE, Disp: 10.2 g, Rfl: 3 .  CALCIUM CITRATE PO, Take 500 mg by mouth daily., Disp: , Rfl:  .  Cholecalciferol (VITAMIN D3) 1000 UNITS CAPS, Take 1 capsule by mouth daily., Disp: , Rfl:  .  fexofenadine-pseudoephedrine (ALLEGRA-D 24) 180-240 MG per 24 hr tablet, Take 1 tablet by mouth every morning., Disp: , Rfl:  .  fluticasone (FLONASE) 50 MCG/ACT nasal spray, Place 2 sprays into both nostrils daily as needed for allergies or rhinitis., Disp: , Rfl:  .  montelukast (SINGULAIR) 10 MG tablet, Take 1 tablet (10 mg total) by mouth at bedtime., Disp: 30 tablet, Rfl: 3      Objective:   There were no vitals filed for this visit.  Estimated body mass index is 28.32 kg/m as calculated from the following:   Height as of 08/18/18: 5\' 4"  (1.626 m).   Weight as of 08/18/18: 165 lb (74.8 kg).  @WEIGHTCHANGE @  There were no vitals filed for this visit.   Physical Exam         Assessment:       ICD-10-CM   1. Vaccine counseling  Z71.89   2. Moderate persistent asthma without complication  123456        Plan:     Patient Instructions  Vaccine counseling  - no allergy to flu and other vaccine but oral antibiotics there is hives and rash  - overall risk is low for anaphylaxis from covid vaccine but you  are expected to have side effects  - get yourself observed for longer time at [facility] vaccination site -= Ultimately decision is yours  Moderate persistent asthma without complication  - stable without flare up  - dong well with symbicort  Plan  - continue symbicort scheduled with albuterol as neeed  Followup  6 months face to face  - can come to [facility] clinic if needed      (Telephone visit - Level 02 visit: Estb 11-20 for this visit type which was visit type: telephone visit in total care time and counseling or/and coordination of care by this undersigned MD - Dr Brand Males. This includes one or more of the following for care delivered on 04/10/2020 same day: pre-charting, chart review, note writing, documentation discussion of test results, diagnostic or treatment recommendations, prognosis, risks and benefits of management options, instructions, education, compliance or risk-factor reduction. It excludes time spent by the [place] or office staff in the care of the patient. Actual time was 15 min. E&M code is [phone])   SIGNATURE    Dr. Brand Males, M.D., F.C.C.P,  Pulmonary and Critical Care Medicine Staff Physician, [place] Director - Interstitial Lung Disease  Program  Pulmonary [place] [facility] [place], [place], [address]  Pager: [phone], If no answer or between  15:00h - 7:00h: call [phone] Telephone: [phone]  9:15 AM 11/01/2019

## 2019-11-12 ENCOUNTER — Ambulatory Visit: Payer: Medicare Other

## 2020-01-05 ENCOUNTER — Other Ambulatory Visit: Payer: Self-pay | Admitting: Primary Care

## 2020-01-10 ENCOUNTER — Ambulatory Visit
Admission: RE | Admit: 2020-01-10 | Discharge: 2020-01-10 | Disposition: A | Discharge status: Home / Self Care | Payer: Medicare Other | Source: Ambulatory Visit | Attending: Family Medicine | Admitting: Family Medicine

## 2020-01-10 ENCOUNTER — Other Ambulatory Visit: Payer: Self-pay

## 2020-01-10 DIAGNOSIS — Z1231 Encounter for screening mammogram for malignant neoplasm of breast: Secondary | ICD-10-CM

## 2020-01-21 ENCOUNTER — Telehealth: Payer: Self-pay | Admitting: Internal Medicine

## 2020-01-21 NOTE — Telephone Encounter (Signed)
Spoke with pt again, we need to check the telehealth code so [place] can pay for the visit. MR can you document this visit 11/01/2019 to reflect a telephone visit because patient confirmed that it was and there are no vitals entered. Please route back to me and I can call Kathlee Nations or [place] billing to let them know when it was addended.    [facility]-[phone]

## 2020-01-21 NOTE — Telephone Encounter (Signed)
error 

## 2020-01-21 NOTE — Telephone Encounter (Signed)
This needs to go to [place]. I will route to her.

## 2020-01-27 NOTE — Telephone Encounter (Signed)
Pt calling back to followup. Pt can be reached at [phone].

## 2020-01-27 NOTE — Telephone Encounter (Signed)
MR was this something you could do or do I have to ask Patrice?

## 2020-01-28 NOTE — Telephone Encounter (Signed)
Dr. Chase Caller will need to do the addendum to the ov to reflect the correct type of visit that was done -pr

## 2020-01-28 NOTE — Telephone Encounter (Signed)
Dr Chase Caller- please do the addendum as stated below per Jonelle Sidle, thanks!  Spoke with pt again, we need to check the telehealth code so [place] can pay for the visit. MR can you document this visit 11/01/2019 to reflect a telephone visit because patient confirmed that it was and there are no vitals entered. Please route back to me and I can call Kathlee Nations or [place] billing to let them know when it was addended.    [facility]-[phone]

## 2020-01-31 ENCOUNTER — Telehealth: Payer: Self-pay | Admitting: Internal Medicine

## 2020-01-31 NOTE — Telephone Encounter (Signed)
Pt called back, would like to hear something today in regards to this.

## 2020-01-31 NOTE — Telephone Encounter (Signed)
MR, please see message from [place]. Pt has called back wanting an update on this.

## 2020-02-02 NOTE — Telephone Encounter (Signed)
Still waiting on response from MR.

## 2020-02-07 NOTE — Telephone Encounter (Signed)
Dr. Chase Caller please advise  Dr Chase Caller- please do the addendum as stated below per Jonelle Sidle, thanks!  Spoke with pt again, we need to check the telehealth code so [place] can pay for the visit. MR can you document this visit 11/01/2019 to reflect a telephone visit because patient confirmed that it was and there are no vitals entered. Please route back to me and I can call Kathlee Nations or [place] billing to let them know when it was addended.   [facility]-[phone]

## 2020-02-25 NOTE — Telephone Encounter (Signed)
Dr. Ramaswamy - please advise. Thanks. 

## 2020-03-09 NOTE — Telephone Encounter (Signed)
MR, please advise if you have changed the code for pt's insurance to make it show up as telehealth visit?

## 2020-03-14 NOTE — Telephone Encounter (Signed)
Called and spoke with pt to see if the code for last appt has been taken care of by MR for insurance or if she was still waiting on this to be handled and she stated she was still waiting for MR to take care of this. Stated to pt that we would check with MR and then call her back once we had more info and she verbalized understanding.  MR, please advise on the message from pt about the code that needs fixed per insurance.

## 2020-03-30 NOTE — Telephone Encounter (Signed)
MR please advise 

## 2020-04-10 NOTE — Telephone Encounter (Signed)
Called and spoke with pt letting her know the info stated by MR and she verbalized understanding. Nothing further needed. 

## 2020-04-10 NOTE — Addendum Note (Signed)
Addended by: Brand Males on: 04/10/2020 02:05 PM   Modules accepted: Level of Service

## 2020-04-10 NOTE — Telephone Encounter (Signed)
Sorry for delay and wrong code. Back then I documented correctly that was a telephone visit an dlevel 02. BAck then I did not know there was a separte telephone level 02. Therefore, I billed regular visit level 02. Is now corrected to telephone level 02

## 2020-04-10 NOTE — Telephone Encounter (Signed)
Dr. Chase Caller is aware and will advise

## 2020-06-27 ENCOUNTER — Other Ambulatory Visit: Payer: Self-pay | Admitting: Internal Medicine

## 2020-06-27 ENCOUNTER — Telehealth: Payer: Self-pay | Admitting: Internal Medicine

## 2020-06-27 MED ORDER — BUDESONIDE-FORMOTEROL FUMARATE 80-4.5 MCG/ACT IN AERO: INHALATION_SPRAY | RESPIRATORY_TRACT | 6 refills | Status: DC

## 2020-06-27 NOTE — Telephone Encounter (Signed)
Called and spoke with the pharmacy, nothing has been denied as far as insurance, a fax was sent over for a refill and they were waiting for a response.  I was advised that she needs a new prescription.  Medication refilled.  Called and spoke with the patient advised that the script was sent over with refills.  She verbalized understanding.  Nothing further needed.

## 2020-09-28 ENCOUNTER — Telehealth: Payer: Self-pay | Admitting: Internal Medicine

## 2020-09-28 NOTE — Telephone Encounter (Signed)
Patient returned phone call, confirmed DOB. She reports MR via telephone visit in 2019/11/17 her visit was coded in-correctly. She reports this visit was coded as in office visit. This visit was done via telephone. Patient was not see in office. Patient still pending bill stating this was an in office visit. [facility] re-submitted the claim and it is still showing an in office visit. She reports a year later she is still dealing with this and would like to get this issue resolved as soon as possible.   I made the patient aware I would route this to emily to see if she has any knowledge of this. I will also route message to our in house biller Kathlee Nations to see if she has any recommendations.   Patient voiced understanding however voiced that she is very frustrated that this has not been taken care of.   Patient is requesting that this be re-billed asap.   Kathlee Nations please advise.   Thanks

## 2020-09-28 NOTE — Telephone Encounter (Signed)
LMTCB.   Will route message to Raquel Sarna to see if she has any insight.

## 2020-10-02 NOTE — Telephone Encounter (Signed)
Please refer to phone encounter from 01/21/20.

## 2020-10-11 NOTE — Telephone Encounter (Signed)
Called and spoke with pt letting her know the info stated by Kathlee Nations that OV was changed from [place] to telephone visit.  Pt verbalized understanding. Nothing further needed.

## 2020-10-11 NOTE — Telephone Encounter (Signed)
This was changed from an office visit to a telephone visit and the patient's insurance has reprocessed.  No further action should be needed.

## 2020-10-24 ENCOUNTER — Other Ambulatory Visit: Payer: Self-pay | Admitting: Family Medicine

## 2020-10-24 DIAGNOSIS — Z1231 Encounter for screening mammogram for malignant neoplasm of breast: Secondary | ICD-10-CM

## 2020-10-26 ENCOUNTER — Other Ambulatory Visit: Payer: Self-pay | Admitting: Family Medicine

## 2020-10-26 DIAGNOSIS — E2839 Other primary ovarian failure: Secondary | ICD-10-CM

## 2020-11-20 ENCOUNTER — Telehealth: Payer: Self-pay | Admitting: Internal Medicine

## 2020-11-20 NOTE — Telephone Encounter (Signed)
ATC Patient.  LM to call back. 

## 2020-11-20 NOTE — Telephone Encounter (Signed)
Called and spoke with Patient. Patient stated she is having billing issues from televisit 11/01/19, with MR.  Patient stated [employer] have been trying to resolve her billing issue.  Patient stated MR needed to change his note to stated telephone visit. Advised MR has made changes and Kathlee Nations, our billing person has looked into issue. Patient stated she has a 3rd insurance, [employer] that she can use if needed, but claim has to be turned into them within 1 year.  Patient stated original OV submission to insurance was 11/26/19. Patient stated she has talked with [facility] billing and they have told her she owes for visit. Patient stated [employer] has asked bill to be resubmitted and it was last submitted 11/17/20, and today bill has still not be sent to [facility] properly and stated she was told to [facility] with any further issues. Per Kathlee Nations, 10/11/20- This was changed from an office visit to a telephone visit and the patient's insurance has reprocessed.  No further action should be needed. Advised Patient to contact [facility] billing for mor information, but Patient stated [facility] should be abe to assist in getting something done. Patient stated she would contact  management if she is still having issues next week.    Message routed to Kathlee Nations and [facility]

## 2020-11-21 NOTE — Telephone Encounter (Signed)
Checked on patient's account, this has been re-filed as a televisit to [employer] and is still pending insurance.  The patient does not have a balance at this time.  We have to wait for the response from [facility]

## 2020-11-23 NOTE — Telephone Encounter (Signed)
lmtcb for pt.  

## 2020-11-23 NOTE — Telephone Encounter (Signed)
Called spoke to patient.  Let her know Liz's recommendations.  Patient voiced understanding.

## 2020-12-12 ENCOUNTER — Encounter: Payer: Self-pay | Admitting: Internal Medicine

## 2020-12-12 ENCOUNTER — Other Ambulatory Visit: Payer: Self-pay

## 2020-12-12 ENCOUNTER — Ambulatory Visit (INDEPENDENT_AMBULATORY_CARE_PROVIDER_SITE_OTHER): Payer: Medicare Other | Admitting: Internal Medicine

## 2020-12-12 VITALS — BP 142/88 | HR 67 | Temp 97.3°F | Ht 64.0 in | Wt 155.0 lb

## 2020-12-12 DIAGNOSIS — R1030 Lower abdominal pain, unspecified: Secondary | ICD-10-CM | POA: Insufficient documentation

## 2020-12-12 DIAGNOSIS — F411 Generalized anxiety disorder: Secondary | ICD-10-CM | POA: Insufficient documentation

## 2020-12-12 DIAGNOSIS — D649 Anemia, unspecified: Secondary | ICD-10-CM | POA: Insufficient documentation

## 2020-12-12 DIAGNOSIS — D259 Leiomyoma of uterus, unspecified: Secondary | ICD-10-CM | POA: Insufficient documentation

## 2020-12-12 DIAGNOSIS — F410 Panic disorder [episodic paroxysmal anxiety] without agoraphobia: Secondary | ICD-10-CM | POA: Insufficient documentation

## 2020-12-12 DIAGNOSIS — N63 Unspecified lump in unspecified breast: Secondary | ICD-10-CM | POA: Insufficient documentation

## 2020-12-12 DIAGNOSIS — N952 Postmenopausal atrophic vaginitis: Secondary | ICD-10-CM | POA: Insufficient documentation

## 2020-12-12 DIAGNOSIS — J4540 Moderate persistent asthma, uncomplicated: Secondary | ICD-10-CM

## 2020-12-12 MED ORDER — BUDESONIDE-FORMOTEROL FUMARATE 80-4.5 MCG/ACT IN AERO: INHALATION_SPRAY | RESPIRATORY_TRACT | 6 refills | Status: DC

## 2020-12-12 NOTE — Patient Instructions (Addendum)
Moderate persistent asthma without complication  - stable without flare up  - dong well with symbicort but needing full dose  Plan  - continue symbicort 2 puff twice daily scheduled with albuterol as neeed  =- cma to ensure refill  Followup  6 -12  months face to face

## 2020-12-12 NOTE — Progress Notes (Signed)
OV 12/12/2020  Subjective:  Patient ID: Catherine Chambers, female , DOB: [birth year] , age 75 y.o. , MRN: [id number] , ADDRESS: [address] [place] [address] PCP Derinda Late, MD Patient Care Team: Derinda Late, MD as PCP - General (Family Medicine)  This Provider for this visit: Treatment Team:  Attending Provider: Brand Males, MD    12/12/2020 -   Chief Complaint  Patient presents with  . Asthma    1 year follow up, feeling well, denies SOB, DOE     HPI Catherine Chambers 75 y.o. -presents for asthma.  Last visit was over a year ago.  She tells me that at the last visit was a telephone visit but we accidentally billed her for face-to-face visit.  She states that she has been dealing with [place] billing over this.  They finally agreed to resolve the matter and she will not have to pay for this.  In any event currently she is doing well from asthma.  She has had 4 doses of the COVID-vaccine.  She try to take Symbicort 1 puff once daily versus twice daily but this was inadequate.  She feels better relief with 2 puffs 2 times daily which is the full dose.  Albuterol use is rare.  She not waking up in the middle of the night with any symptoms.  She feels asthma is well controlled.     PFT  No flowsheet data found.     has a past medical history of Anemia, Anxiety, Chronic allergic conjunctivitis, DDD (degenerative disc disease), lumbar, Depression, History of cervical polypectomy, History of concussion, History of uterine fibroid, Moderate persistent asthma without complication, Multiple chemical sensitivity syndrome, Perennial allergic rhinitis with seasonal variation, PMB (postmenopausal bleeding), and Submucous uterine fibroid.   reports that she has never smoked. She has never used smokeless tobacco.  Past Surgical History:  Procedure Laterality Date  . BUNIONECTOMY Right 2009  . CERVICAL CERCLAGE  x2    . COLONOSCOPY WITH  PROPOFOL N/A 07/12/2014   Procedure: COLONOSCOPY WITH PROPOFOL;  Surgeon: Garlan Fair, MD;  Location: [facility];  Service: Endoscopy;  Laterality: N/A;  . DILATION AND CURETTAGE OF UTERUS  1973  . DILITATION & CURRETTAGE/HYSTROSCOPY WITH VERSAPOINT RESECTION N/A 03/28/2015   Procedure: DILATATION & CURETTAGE/HYSTEROSCOPY WITH VERSAPOINT RESECTION;  Surgeon: Ena Dawley, MD;  Location: [facility];  Service: Gynecology;  Laterality: N/A;  . NASAL SINUS SURGERY  1989  . TUBAL LIGATION  1981    Allergies  Allergen Reactions  . Amoxicillin Hives, Shortness Of Breath and Itching  . Erythromycin Hives, Shortness Of Breath and Swelling  . Latex Rash and Other (See Comments)    Skin gets irritated and itchy  . Sulfa Antibiotics Hives, Shortness Of Breath and Swelling  . Keflex [Cephalexin] Nausea And Vomiting    severe  . Penicillins Hives    Childhood reaction     Immunization History  Administered Date(s) Administered  . Fluad Quad(high Dose 65+) 04/09/2019  . Influenza Split 07/30/2012, 06/19/2014  . Influenza, High Dose Seasonal PF 04/19/2017, 04/15/2018, 04/20/2019  . Influenza,inj,Quad PF,6+ Mos 06/07/2013, 06/01/2015, 04/23/2016  . Influenza-Unspecified 05/19/2002, 08/01/2003, 03/27/2019  . PFIZER(Purple Top)SARS-COV-2 Vaccination 11/02/2019, 11/23/2019, 06/11/2020  . Pneumococcal Conjugate-13 01/12/2015  . Pneumococcal Polysaccharide-23 01/25/2016  . Td 08/20/2010, 10/05/2018  . Tdap 01/12/2015, 09/20/2018  . Zoster 02/21/2015    Family History  Problem Relation Age of Onset  . Heart disease Father   .  Cancer Mother        abdominal  . Thyroid disease Sister        HYPO THYROID  . Thyroid disease Daughter        HYPO THYROID     Current Outpatient Medications:  .  CALCIUM CITRATE PO, Take 500 mg by mouth daily., Disp: , Rfl:  .  Cholecalciferol (VITAMIN D3) 1000 UNITS CAPS, Take 1 capsule by mouth daily., Disp: , Rfl:  .  Fexofenadine HCl  (ALLEGRA PO), Take by mouth., Disp: , Rfl:  .  NON FORMULARY, Netipot, Disp: , Rfl:  .  budesonide-formoterol (SYMBICORT) 80-4.5 MCG/ACT inhaler, INHALE 2 PUFFS BY MOUTH TWICE DAILY AS DIRECTED. RINSE AFTER USE, Disp: 10.2 g, Rfl: 6      Objective:   Vitals:   12/12/20 0904  BP: (!) 142/88  Pulse: 67  Temp: (!) 97.3 F (36.3 C)  TempSrc: Temporal  SpO2: 99%  Weight: 155 lb (70.3 kg)  Height: 5\' 4"  (1.626 m)    Estimated body mass index is 26.61 kg/m as calculated from the following:   Height as of this encounter: 5\' 4"  (1.626 m).   Weight as of this encounter: 155 lb (70.3 kg).  @WEIGHTCHANGE @  [employer]   12/12/20 0904  Weight: 155 lb (70.3 kg)     Physical ExamGeneral: No distress. Look swell Neuro: Alert and Oriented x 3. GCS 15. Speech normal Psych: Pleasant Resp:  Barrel Chest - no.  Wheeze - no, Crackles - no, No overt respiratory distress CVS: Normal heart sounds. Murmurs - no Ext: Stigmata of Connective Tissue Disease - no HEENT: Normal upper airway. PEERL +. No post nasal drip        Assessment:       ICD-10-CM   1. Moderate persistent asthma without complication  C58.52 budesonide-formoterol (SYMBICORT) 80-4.5 MCG/ACT inhaler       Plan:     Patient Instructions  Moderate persistent asthma without complication  - stable without flare up  - dong well with symbicort but needing full dose  Plan  - continue symbicort 2 puff twice daily scheduled with albuterol as neeed  =- cma to ensure refill  Followup  6 -12  months face to face       SIGNATURE    Dr. Brand Males, M.D., F.C.C.P,  Pulmonary and Critical Care Medicine Staff Physician, [place] Director - Interstitial Lung Disease  Program  Pulmonary [facility] at [place], [place], [address]  Pager: [phone], If no answer or between  15:00h - 7:00h: call [phone] Telephone: [phone]  9:24  AM 12/12/2020

## 2021-01-16 ENCOUNTER — Other Ambulatory Visit: Payer: Self-pay

## 2021-01-16 ENCOUNTER — Ambulatory Visit
Admission: RE | Admit: 2021-01-16 | Discharge: 2021-01-16 | Disposition: A | Discharge status: Home / Self Care | Payer: Medicare Other | Source: Ambulatory Visit | Attending: Family Medicine | Admitting: Family Medicine

## 2021-01-16 DIAGNOSIS — Z1231 Encounter for screening mammogram for malignant neoplasm of breast: Secondary | ICD-10-CM

## 2021-04-03 ENCOUNTER — Ambulatory Visit
Admission: RE | Admit: 2021-04-03 | Discharge: 2021-04-03 | Disposition: A | Discharge status: Home / Self Care | Payer: Medicare Other | Source: Ambulatory Visit | Attending: Family Medicine | Admitting: Family Medicine

## 2021-04-03 ENCOUNTER — Other Ambulatory Visit: Payer: Self-pay

## 2021-04-03 DIAGNOSIS — E2839 Other primary ovarian failure: Secondary | ICD-10-CM

## 2021-05-24 ENCOUNTER — Ambulatory Visit: Payer: Medicare Other | Attending: Internal Medicine

## 2021-05-24 ENCOUNTER — Other Ambulatory Visit (HOSPITAL_BASED_OUTPATIENT_CLINIC_OR_DEPARTMENT_OTHER): Payer: Self-pay

## 2021-05-24 DIAGNOSIS — Z23 Encounter for immunization: Secondary | ICD-10-CM

## 2021-05-24 MED ORDER — PFIZER COVID-19 VAC BIVALENT 30 MCG/0.3ML IM SUSP
INTRAMUSCULAR | 0 refills | Status: AC
  Filled 2021-05-24: qty 0.3, 1d supply, fill #0

## 2021-05-24 NOTE — Progress Notes (Signed)
   Covid-19 Vaccination Clinic  Name:  Christyann Manolis    MRN: [id number] DOB: [birth year]  05/24/2021  Ms. Maravilla was observed post Covid-19 immunization for 15 minutes without incident. She was provided with Vaccine Information Sheet and instruction to access the V-Safe system.   Ms. Malina was instructed to call 911 with any severe reactions post vaccine: Difficulty breathing  Swelling of face and throat  A fast heartbeat  A bad rash all over body  Dizziness and weakness

## 2021-08-01 ENCOUNTER — Other Ambulatory Visit: Payer: Self-pay | Admitting: Internal Medicine

## 2021-08-01 DIAGNOSIS — J4540 Moderate persistent asthma, uncomplicated: Secondary | ICD-10-CM

## 2021-12-04 ENCOUNTER — Other Ambulatory Visit: Payer: Self-pay | Admitting: Family Medicine

## 2021-12-04 DIAGNOSIS — Z1231 Encounter for screening mammogram for malignant neoplasm of breast: Secondary | ICD-10-CM

## 2021-12-10 ENCOUNTER — Other Ambulatory Visit (HOSPITAL_BASED_OUTPATIENT_CLINIC_OR_DEPARTMENT_OTHER): Payer: Self-pay

## 2022-01-18 ENCOUNTER — Ambulatory Visit
Admission: RE | Admit: 2022-01-18 | Discharge: 2022-01-18 | Disposition: A | Discharge status: Home / Self Care | Payer: Medicare Other | Source: Ambulatory Visit | Attending: Family Medicine | Admitting: Family Medicine

## 2022-01-18 ENCOUNTER — Other Ambulatory Visit: Payer: Self-pay | Admitting: Family Medicine

## 2022-01-18 DIAGNOSIS — N6320 Unspecified lump in the left breast, unspecified quadrant: Secondary | ICD-10-CM

## 2022-01-18 DIAGNOSIS — Z1231 Encounter for screening mammogram for malignant neoplasm of breast: Secondary | ICD-10-CM

## 2022-01-30 ENCOUNTER — Ambulatory Visit
Admission: RE | Admit: 2022-01-30 | Discharge: 2022-01-30 | Disposition: A | Discharge status: Home / Self Care | Payer: Medicare Other | Source: Ambulatory Visit | Attending: Family Medicine | Admitting: Family Medicine

## 2022-01-30 DIAGNOSIS — N6320 Unspecified lump in the left breast, unspecified quadrant: Secondary | ICD-10-CM

## 2022-02-04 ENCOUNTER — Telehealth: Payer: Self-pay | Admitting: Internal Medicine

## 2022-02-04 NOTE — Telephone Encounter (Signed)
Called and spoke with patient. She stated that she logged into her MyChart today to do her e-check-in for her visit on 06/23 with MR. She found several difference diagnoses on her problem list that do not pertain to her. The diagnoses include: anxiety state, lower abdominal pain, breast lump, anemia and uterine leiomyoma. She saw that these codes were added on 12/23/2020, which was her last visit to any [facility] facility.   She has reached out to all of her doctors to see if they had sent over any information to MR for these to be added to her chart. Per patient, everyone denied sending anything.   She wants to know where these codes came from and how can she get them removed from her record.   I advised her that I would need to send a message to MR first to see if he remembers where the codes came from and then we can remove them. She verbalized understanding.   MR, can you please advise? Thanks!

## 2022-02-05 NOTE — Telephone Encounter (Signed)
Generally speaking - the past history is added by various offices and each office adds only what is relevant to them. What of the below - can you read it out to her please and find out she wants removed?  I do not recall me myself adding non-resp issues    Thanks    SIGNATURE    Dr. Brand Males, M.D., F.C.C.P,  Pulmonary and Critical Care Medicine Staff Physician, [place] Director - Interstitial Lung Disease  Program  Medical Director - [facility] Pul[facility] at [place], [place], [address]  NPI Number:  NPI #[id number] [place] Number: [id number]  Pager: [phone], If no answer  -> Check AMION or Try [place] Telephone (clinical office): [phone] Telephone (research): [phone]  7:09 AM 02/05/2022    xxxx  has a past medical history of Anemia, Anxiety, Chronic allergic conjunctivitis, DDD (degenerative disc disease), lumbar, Depression, History of cervical polypectomy, History of concussion, History of uterine fibroid, Moderate persistent asthma without complication, Multiple chemical sensitivity syndrome, Perennial allergic rhinitis with seasonal variation, PMB (postmenopausal bleeding), and Submucous uterine fibroid.   reports that she has never smoked. She has never used smokeless tobacco.  Past Surgical History:  Procedure Laterality Date   BUNIONECTOMY Right 2009   CERVICAL CERCLAGE  x2     COLONOSCOPY WITH PROPOFOL N/A 07/12/2014   Procedure: COLONOSCOPY WITH PROPOFOL;  Surgeon: Garlan Fair, MD;  Location: [facility];  Service: Endoscopy;  Laterality: N/A;   DILATION AND CURETTAGE OF UTERUS  1973   DILITATION & CURRETTAGE/HYSTROSCOPY WITH VERSAPOINT RESECTION N/A 03/28/2015   Procedure: DILATATION & CURETTAGE/HYSTEROSCOPY WITH VERSAPOINT RESECTION;  Surgeon: Ena Dawley, MD;  Location[facility];  Service: Gynecology;  Laterality: N/A;   NASAL SINUS  SURGERY  1989   TUBAL LIGATION  1981    Allergies  Allergen Reactions   Amoxicillin Hives, Shortness Of Breath and Itching   Erythromycin Hives, Shortness Of Breath and Swelling   Latex Rash and Other (See Comments)    Skin gets irritated and itchy   Sulfa Antibiotics Hives, Shortness Of Breath and Swelling   Keflex [Cephalexin] Nausea And Vomiting    severe   Penicillins Hives    Childhood reaction     Immunization History  Administered Date(s) Administered   Fluad Quad(high Dose 65+) 04/09/2019   Influenza Split 07/30/2012, 06/19/2014   Influenza, High Dose Seasonal PF 04/19/2017, 04/15/2018, 04/20/2019   Influenza,inj,Quad PF,6+ Mos 06/07/2013, 06/01/2015, 04/23/2016   Influenza-Unspecified 05/19/2002, 08/01/2003, 03/27/2019   PFIZER(Purple Top)SARS-COV-2 Vaccination 11/02/2019, 11/23/2019, 06/11/2020   Pfizer Covid-19 Vaccine Bivalent Booster 53yr & up 05/24/2021   Pneumococcal Conjugate-13 01/12/2015   Pneumococcal Polysaccharide-23 01/25/2016   Td 08/20/2010, 10/05/2018   Tdap 01/12/2015, 09/20/2018   Zoster, Live 02/21/2015    Family History  Problem Relation Age of Onset   Heart disease Father    Cancer Mother        abdominal   Thyroid disease Sister        HYPO THYROID   Thyroid disease Daughter        HYPO THYROID     Current Outpatient Medications:    budesonide-formoterol (SYMBICORT) 80-4.5 MCG/ACT inhaler, USE 2 INHALATIONS BY MOUTH TWICE DAILY AS DIRECTED. RINSE AFTER USE, Disp: 10.2 g, Rfl: 6   CALCIUM CITRATE PO, Take 500 mg by mouth daily., Disp: , Rfl:    Cholecalciferol (VITAMIN D3) 1000 UNITS CAPS, Take 1  capsule by mouth daily., Disp: , Rfl:    COVID-19 mRNA bivalent vaccine, Pfizer, (PFIZER COVID-19 VAC BIVALENT) injection, Inject into the muscle., Disp: 0.3 mL, Rfl: 0   Fexofenadine HCl (ALLEGRA PO), Take by mouth., Disp: , Rfl:    NON FORMULARY, Netipot, Disp: , Rfl:

## 2022-02-06 NOTE — Telephone Encounter (Signed)
Called and spoke with pt letting her know the info per MR and she verbalized understanding. Nothing further needed. 

## 2022-02-07 NOTE — Progress Notes (Unsigned)
OV 12/12/2020  Subjective:  Patient ID: Catherine Chambers, female , DOB: 1945/12/22 , age 76 y.o. , MRN: [id number] , ADDRESS: [address] [place] [address] PCP Derinda Late, MD Patient Care Team: Derinda Late, MD as PCP - General (Family Medicine)  This Provider for this visit: Treatment Team:  Attending Provider: Brand Males, MD    12/12/2020 -   Chief Complaint  Patient presents with   Asthma    1 year follow up, feeling well, denies SOB, DOE     HPI Catherine Chambers 76 y.o. -presents for asthma.  Last visit was over a year ago.  She tells me that at the last visit was a telephone visit but we accidentally billed her for face-to-face visit.  She states that she has been dealing with [place] billing over this.  They finally agreed to resolve the matter and she will not have to pay for this.  In any event currently she is doing well from asthma.  She has had 4 doses of the COVID-vaccine.  She try to take Symbicort 1 puff once daily versus twice daily but this was inadequate.  She feels better relief with 2 puffs 2 times daily which is the full dose.  Albuterol use is rare.  She not waking up in the middle of the night with any symptoms.  She feels asthma is well controlled.     PFT  No flowsheet data found.  OV 02/07/2022  Subjective:  Patient ID: Catherine Chambers, female , DOB: 1946-02-05 , age 37 y.o. , MRN: [id number] , ADDRESS: [address] [place] [address] PCP Derinda Late, MD Patient Care Team: Derinda Late, MD as PCP - General (Family Medicine)  This Provider for this visit: Treatment Team:  Attending Provider: Brand Males, MD    02/07/2022 -  No chief complaint on file.    HPI Catherine Chambers 76 y.o. -    CT Chest data  No results found.    PFT      No data to display             has a past medical history of Anemia, Anxiety, Chronic allergic  conjunctivitis, DDD (degenerative disc disease), lumbar, Depression, History of cervical polypectomy, History of concussion, History of uterine fibroid, Moderate persistent asthma without complication, Multiple chemical sensitivity syndrome, Perennial allergic rhinitis with seasonal variation, PMB (postmenopausal bleeding), and Submucous uterine fibroid.   reports that she has never smoked. She has never used smokeless tobacco.  Past Surgical History:  Procedure Laterality Date   BUNIONECTOMY Right 2009   CERVICAL CERCLAGE  x2     COLONOSCOPY WITH PROPOFOL N/A 07/12/2014   Procedure: COLONOSCOPY WITH PROPOFOL;  Surgeon: Garlan Fair, MD;  Location: [facility];  Service: Endoscopy;  Laterality: N/A;   DILATION AND CURETTAGE OF UTERUS  1973   DILITATION & CURRETTAGE/HYSTROSCOPY WITH VERSAPOINT RESECTION N/A 03/28/2015   Procedure: DILATATION & CURETTAGE/HYSTEROSCOPY WITH VERSAPOINT RESECTION;  Surgeon: Ena Dawley, MD;  Location: [facility];  Service: Gynecology;  Laterality: N/A;   NASAL SINUS SURGERY  1989   TUBAL LIGATION  1981    Allergies  Allergen Reactions   Amoxicillin Hives, Shortness Of Breath and Itching   Erythromycin Hives, Shortness Of Breath and Swelling   Latex Rash and Other (See Comments)    Skin gets irritated and itchy   Sulfa Antibiotics Hives, Shortness Of Breath and Swelling   Keflex [Cephalexin] Nausea  And Vomiting    severe   Penicillins Hives    Childhood reaction     Immunization History  Administered Date(s) Administered   Fluad Quad(high Dose 65+) 04/09/2019   Influenza Split 07/30/2012, 06/19/2014   Influenza, High Dose Seasonal PF 04/19/2017, 04/15/2018, 04/20/2019   Influenza,inj,Quad PF,6+ Mos 06/07/2013, 06/01/2015, 04/23/2016   Influenza-Unspecified 05/19/2002, 08/01/2003, 03/27/2019   PFIZER(Purple Top)SARS-COV-2 Vaccination 11/02/2019, 11/23/2019, 06/11/2020   Pfizer Covid-19 Vaccine Bivalent Booster 40yr & up  05/24/2021   Pneumococcal Conjugate-13 01/12/2015   Pneumococcal Polysaccharide-23 01/25/2016   Td 08/20/2010, 10/05/2018   Tdap 01/12/2015, 09/20/2018   Zoster, Live 02/21/2015    Family History  Problem Relation Age of Onset   Heart disease Father    Cancer Mother        abdominal   Thyroid disease Sister        HYPO THYROID   Thyroid disease Daughter        HYPO THYROID     Current Outpatient Medications:    budesonide-formoterol (SYMBICORT) 80-4.5 MCG/ACT inhaler, USE 2 INHALATIONS BY MOUTH TWICE DAILY AS DIRECTED. RINSE AFTER USE, Disp: 10.2 g, Rfl: 6   CALCIUM CITRATE PO, Take 500 mg by mouth daily., Disp: , Rfl:    Cholecalciferol (VITAMIN D3) 1000 UNITS CAPS, Take 1 capsule by mouth daily., Disp: , Rfl:    COVID-19 mRNA bivalent vaccine, Pfizer, (PFIZER COVID-19 VAC BIVALENT) injection, Inject into the muscle., Disp: 0.3 mL, Rfl: 0   Fexofenadine HCl (ALLEGRA PO), Take by mouth., Disp: , Rfl:    NON FORMULARY, Netipot, Disp: , Rfl:       Objective:   There were no vitals filed for this visit.  Estimated body mass index is 26.61 kg/m as calculated from the following:   Height as of 12/12/20: '5\' 4"'$  (1.626 m).   Weight as of 12/12/20: 155 lb (70.3 kg).  '@WEIGHTCHANGE'$ @  There were no vitals filed for this visit.   Physical Exam  General Appearance:    Alert, cooperative, no distress, appears stated age - *** , Deconditioned looking - *** , OBESE  - ***, Sitting on Wheelchair -  ***  Head:    Normocephalic, without obvious abnormality, atraumatic  Eyes:    PERRL, conjunctiva/corneas clear,  Ears:    Normal TM's and external ear canals, both ears  Nose:   Nares normal, septum midline, mucosa normal, no drainage    or sinus tenderness. OXYGEN ON  - *** . Patient is @ ***   Throat:   Lips, mucosa, and tongue normal; teeth and gums normal. Cyanosis on lips - ***  Neck:   Supple, symmetrical, trachea midline, no adenopathy;    thyroid:  no  enlargement/tenderness/nodules; no carotid   bruit or JVD  Back:     Symmetric, no curvature, ROM normal, no CVA tenderness  Lungs:     Distress - *** , Wheeze ***, Barrell Chest - ***, Purse lip breathing - ***, Crackles - ***   Chest Wall:    No tenderness or deformity.    Heart:    Regular rate and rhythm, S1 and S2 normal, no rub   or gallop, Murmur - ***  Breast Exam:    NOT DONE  Abdomen:     Soft, non-tender, bowel sounds active all four quadrants,    no masses, no organomegaly. Visceral obesity - ***  Genitalia:   NOT DONE  Rectal:   NOT DONE  Extremities:   Extremities - normal, Has Cane - ***, Clubbing - ***,  Edema - ***  Pulses:   2+ and symmetric all extremities  Skin:   Stigmata of Connective Tissue Disease - ***  Lymph nodes:   Cervical, supraclavicular, and axillary nodes normal  Psychiatric:  Neurologic:   Pleasant - ***, Anxious - ***, Flat affect - ***  CAm-ICU - neg, Alert and Oriented x 3 - yes, Moves all 4s - yes, Speech - normal, Cognition - intact    General: No distress. *** Neuro: Alert and Oriented x 3. GCS 15. Speech normal Psych: Pleasant Resp:  Barrel Chest - ***.  Wheeze - ***, Crackles - ***, No overt respiratory distress CVS: Normal heart sounds. Murmurs - *** Ext: Stigmata of Connective Tissue Disease - *** HEENT: Normal upper airway. PEERL +. No post nasal drip        Assessment:     No diagnosis found.     Plan:     There are no Patient Instructions on file for this visit.    SIGNATURE    Dr. Brand Males, M.D., F.C.C.P,  Pulmonary and Critical Care Medicine Staff Physician, [place] Director - Interstitial Lung Disease  Program  Pulmonary [place] at [place], [place], [address]  Pager: [phone], If no answer or between  15:00h - 7:00h: call [phone] Telephone: [phone]  6:35 PM 02/07/2022

## 2022-02-07 NOTE — Patient Instructions (Signed)
Moderate persistent asthma without complication  - stable without flare up  - dong well with symbicort but needing full dose  Plan  - continue symbicort 2 puff twice daily scheduled with albuterol as neeed  =- cma to ensure refill  Followup  6 -12  months face to face

## 2022-02-08 ENCOUNTER — Encounter: Payer: Self-pay | Admitting: Internal Medicine

## 2022-02-08 ENCOUNTER — Ambulatory Visit (INDEPENDENT_AMBULATORY_CARE_PROVIDER_SITE_OTHER): Payer: Medicare Other | Admitting: Internal Medicine

## 2022-02-08 VITALS — BP 102/62 | HR 77 | Ht 64.5 in | Wt 164.6 lb

## 2022-02-08 DIAGNOSIS — Z7185 Encounter for immunization safety counseling: Secondary | ICD-10-CM

## 2022-02-08 DIAGNOSIS — J4540 Moderate persistent asthma, uncomplicated: Secondary | ICD-10-CM

## 2022-02-08 MED ORDER — ALBUTEROL SULFATE 108 (90 BASE) MCG/ACT IN AEPB: 2.0000 | INHALATION_SPRAY | Freq: Four times a day (QID) | RESPIRATORY_TRACT | 6 refills | Status: DC | PRN

## 2022-02-08 MED ORDER — EPINEPHRINE 0.3 MG/0.3ML IJ SOAJ: 0.3000 mg | Freq: Once | INTRAMUSCULAR | 6 refills | Status: AC

## 2022-02-08 MED ORDER — BUDESONIDE-FORMOTEROL FUMARATE 80-4.5 MCG/ACT IN AERO: INHALATION_SPRAY | RESPIRATORY_TRACT | 6 refills | Status: DC

## 2022-02-14 ENCOUNTER — Other Ambulatory Visit (HOSPITAL_COMMUNITY): Payer: Self-pay

## 2022-02-15 ENCOUNTER — Telehealth: Payer: Self-pay | Admitting: Internal Medicine

## 2022-02-15 MED ORDER — ALBUTEROL SULFATE HFA 108 (90 BASE) MCG/ACT IN AERS: 1.0000 | INHALATION_SPRAY | Freq: Four times a day (QID) | RESPIRATORY_TRACT | 3 refills | Status: DC | PRN

## 2022-02-15 NOTE — Telephone Encounter (Signed)
Rx for proventil hfa has been sent to pharmacy for pt. Called and spoke with pt letting her know this had been done and she verbalized understanding. Nothing further needed.

## 2022-06-06 ENCOUNTER — Other Ambulatory Visit (HOSPITAL_BASED_OUTPATIENT_CLINIC_OR_DEPARTMENT_OTHER): Payer: Self-pay

## 2022-06-06 MED ORDER — INFLUENZA VAC A&B SA ADJ QUAD 0.5 ML IM PRSY
PREFILLED_SYRINGE | INTRAMUSCULAR | 0 refills | Status: AC
  Filled 2022-06-06: qty 0.5, 1d supply, fill #0

## 2022-07-23 ENCOUNTER — Telehealth: Payer: Self-pay | Admitting: Internal Medicine

## 2022-07-23 NOTE — Telephone Encounter (Signed)
Patient states spoke to insurance company on the phone. Insurance states medications will be covered next year. Does not need a call back.

## 2022-08-22 ENCOUNTER — Other Ambulatory Visit: Payer: Self-pay | Admitting: Family Medicine

## 2022-08-22 DIAGNOSIS — Z1231 Encounter for screening mammogram for malignant neoplasm of breast: Secondary | ICD-10-CM

## 2022-08-23 ENCOUNTER — Other Ambulatory Visit (HOSPITAL_COMMUNITY): Payer: Self-pay

## 2022-08-24 ENCOUNTER — Telehealth: Payer: Self-pay | Admitting: Pulmonary Disease

## 2022-08-24 NOTE — Telephone Encounter (Addendum)
PCCM weekend coverage note  Patient called to say that her pharmacy changed her symbicort to generic symbicort starting this year. She wants to know if this is just as good as the brand name inhaler. I told her that the generic should be just as effective and it is ok to switch over. She will call back if she has any issues with the new inhaler.   Marshell Garfinkel MD [facility] See Amion for pager  If no response to pager , please call [phone] until 7pm After 7:00 pm call Elink  [phone] 08/24/2022, 12:28 PM

## 2022-10-17 ENCOUNTER — Encounter: Payer: Self-pay | Admitting: Nurse Practitioner

## 2022-10-17 ENCOUNTER — Other Ambulatory Visit: Payer: Self-pay | Admitting: Internal Medicine

## 2022-10-17 DIAGNOSIS — J4540 Moderate persistent asthma, uncomplicated: Secondary | ICD-10-CM

## 2022-10-17 MED ORDER — BUDESONIDE-FORMOTEROL FUMARATE 80-4.5 MCG/ACT IN AERO: INHALATION_SPRAY | RESPIRATORY_TRACT | 6 refills | Status: DC

## 2022-10-17 NOTE — Progress Notes (Signed)
10/17/2022: After hours call for refill of generic symbicort. Rx sent to requested pharmacy. Pt due for follow up June 2024. Understands to call back 3 months before for scheduling. Nothing further needed

## 2022-11-08 ENCOUNTER — Other Ambulatory Visit (HOSPITAL_COMMUNITY): Payer: Self-pay

## 2022-11-08 ENCOUNTER — Telehealth: Payer: Self-pay | Admitting: Internal Medicine

## 2022-11-08 NOTE — Telephone Encounter (Signed)
PT would like to speak with Dr. Donald Prose nurse about her generic  Symbicort. Having issues.  Pls call @ [phone]

## 2022-11-08 NOTE — Telephone Encounter (Signed)
Per test claims Wixela and Memory Dance are covered through patients [employer] and [employer] for $0.00 at this time.

## 2022-11-08 NOTE — Telephone Encounter (Signed)
Dr. Chase Caller please see response below from out PA team Which inhaler do you want sent? Wixela or Breo. Please advise on dosage as well

## 2022-11-08 NOTE — Telephone Encounter (Signed)
Patient has been using generic symbicort since January. No issues with inhaler in the beginning. States in February starting having chest discomfort, dizziness, and tightness in throat occ. She advises over this month symptoms have slightly increased and she worries about the tightness in throat.  Dr. Chase Caller can you please advise?  Routing message to prior auth team so ticket can be ran on insurance to see what other inhaler is preferred

## 2022-11-11 NOTE — Telephone Encounter (Signed)
PT. CALLING BACK TO ASK ABOUT HER SYMBICORT BUT WISHED TO SPEAK TO DR. AND WANTS TO KNOW WHAT BREO DOES DIFFERENT THAT THE SYMBICORT

## 2022-11-11 NOTE — Telephone Encounter (Signed)
She can try Breo low-dose 1 puff once daily instead of Symbicort.  And then she should call back if she is not tolerating it fine

## 2022-11-12 MED ORDER — FLUTICASONE FUROATE-VILANTEROL 100-25 MCG/ACT IN AEPB: 1.0000 | INHALATION_SPRAY | Freq: Every day | RESPIRATORY_TRACT | 11 refills | Status: DC

## 2022-11-12 NOTE — Telephone Encounter (Signed)
Patient checking on message about medication. Patient phone number is [phone].

## 2022-11-12 NOTE — Telephone Encounter (Signed)
Spoke with the pt  She states that she has already called and spoke with her pharamcist about Memory Dance  She has done her research and decided that she does want to start taking this  I have sent in rx to her preferred pharmacy  Nothing further needed

## 2023-01-09 ENCOUNTER — Other Ambulatory Visit: Payer: Self-pay | Admitting: Family Medicine

## 2023-01-09 DIAGNOSIS — Z780 Asymptomatic menopausal state: Secondary | ICD-10-CM

## 2023-02-12 ENCOUNTER — Encounter: Payer: Self-pay | Admitting: Internal Medicine

## 2023-02-12 ENCOUNTER — Telehealth: Payer: Self-pay | Admitting: Internal Medicine

## 2023-02-12 ENCOUNTER — Ambulatory Visit (INDEPENDENT_AMBULATORY_CARE_PROVIDER_SITE_OTHER): Payer: Medicare Other | Admitting: Internal Medicine

## 2023-02-12 ENCOUNTER — Ambulatory Visit
Admission: RE | Admit: 2023-02-12 | Discharge: 2023-02-12 | Disposition: A | Discharge status: Home / Self Care | Payer: Medicare Other | Source: Ambulatory Visit | Attending: Family Medicine | Admitting: Family Medicine

## 2023-02-12 VITALS — BP 110/70 | HR 52 | Ht 63.0 in | Wt 160.2 lb

## 2023-02-12 DIAGNOSIS — Z7185 Encounter for immunization safety counseling: Secondary | ICD-10-CM | POA: Diagnosis not present

## 2023-02-12 DIAGNOSIS — J4540 Moderate persistent asthma, uncomplicated: Secondary | ICD-10-CM

## 2023-02-12 DIAGNOSIS — Z1231 Encounter for screening mammogram for malignant neoplasm of breast: Secondary | ICD-10-CM

## 2023-02-12 MED ORDER — ALBUTEROL SULFATE HFA 108 (90 BASE) MCG/ACT IN AERS: 1.0000 | INHALATION_SPRAY | Freq: Four times a day (QID) | RESPIRATORY_TRACT | 3 refills | Status: DC | PRN

## 2023-02-12 NOTE — Patient Instructions (Addendum)
Moderate persistent asthma without complication  - stable without flare up other than spring 2024 with pollen/gardern exposure  - some wheeze but overall you are naturally working yourself out of the mild filare up - dong well  on breo  Plan -  - continue breo 1 puff twice daily scheduled  - do albuterol as neeed - respiclick version - 1 script for epi-pen if you want it 02/12/2023  =- cma to ensure refill for 1 year of above medicines - continue masking when with clusters and indoors - flu shot and RSV vaccine in fall - respect no cxr today 02/12/2023   Followup  12  months face to face

## 2023-02-12 NOTE — Progress Notes (Signed)
OV 12/12/2020  Subjective:  Patient ID: Catherine Chambers, female , DOB: 10-10-1945 , age 77 y.o. , MRN: [id number] , ADDRESS: [address] [place] [address] PCP Mosetta Putt, MD Patient Care Team: Mosetta Putt, MD as PCP - General (Family Medicine)  This Provider for this visit: Treatment Team:  Attending Provider: Kalman Shan, MD    12/12/2020 -   Chief Complaint  Patient presents with   Asthma    1 year follow up, feeling well, denies SOB, DOE     HPI Catherine Chambers 77 y.o. -presents for asthma.  Last visit was over a year ago.  She tells me that at the last visit was a telephone visit but we accidentally billed her for face-to-face visit.  She states that she has been dealing with [facility] billing over this.  They finally agreed to resolve the matter and she will not have to pay for this.  In any event currently she is doing well from asthma.  She has had 4 doses of the COVID-vaccine.  She try to take Symbicort 1 puff once daily versus twice daily but this was inadequate.  She feels better relief with 2 puffs 2 times daily which is the full dose.  Albuterol use is rare.  She not waking up in the middle of the night with any symptoms.  She feels asthma is well controlled.     PFT  No flowsheet data found.  OV 02/08/2022  Subjective:  Patient ID: Catherine Chambers, female , DOB: [birth year] , age 77 y.o. , MRN: [id number] , ADDRESS: [address] [place] [address] PCP Mosetta Putt, MD Patient Care Team: Mosetta Putt, MD as PCP - General (Family Medicine)  This Provider for this visit: Treatment Team:  Attending Provider: Kalman Shan, MD    02/08/2022 -   Chief Complaint  Patient presents with   Follow-up    Pt states no SOB, cough or wheeze, breathing is "good"   Follow-up moderate persistent asthma.  On Symbicort  HPI Catherine Chambers 77 y.o. -returns for follow-up.  She actually  called into the office saying that medical issues like panic attack and anxiety showed up on the records.  Apparently this happened after she visited me last time.  Other physicians have denied that they were responsible for this.  She was told by a physician that somewhere along the way the medical records pulled in different information.  She is not sure who did it but it did correlate with an office visit to our visit office.  I personally am not given at this diagnosis.  I reviewed the medical problems it is listed that his panic attacks and anxiety.  I have offered to delete this and actually deleted it.  In terms of asthma she is stable.  She says she masks with clusters and so far she has not gotten COVID.  She believes and masking.  She continues her Symbicort.  After the spring season she will reduce the intensity of Symbicort.  But otherwise she takes 2 puffs 2 times daily.  There are no new issues.  She feels her asthma is under excellent control.  She wants refills on her medications   OV 02/12/2023  Subjective:  Patient ID: Catherine Chambers, female , DOB: October 11, 1945 , age 77 y.o. , MRN: [id number] , ADDRESS: [address] [place] [address] PCP Mosetta Putt, MD Patient Care Team: Mosetta Putt, MD as PCP -  General (Family Medicine)  This Provider for this visit: Treatment Team:  Attending Provider: Kalman Shan, MD    02/12/2023 -   Chief Complaint  Patient presents with   Follow-up    Annual f/up no complaints     HPI Catherine Chambers 77 y.o. -returns for annual asthma follow-up.  She says she been doing well overall.  No new medical problems no emergency room visits no urgent care visits no prednisone's no surgeries.  However in the spring 2024 with gardening and exposure to pollen and dust she had significant cough but she says she has worked herself out.  She does not like to do prednisone.  Symptoms have improved spontaneously except for  some mild wheezing that she can feel.  She is not interested in prednisone at the moment.  She is not interested in chest x-ray today.  Last chest x-ray was in 2013.  Of note she is currently on Breo which which is working well for her.  She finds it more convenient.  She feels she was running out of control after years of being on Symbicort.  We discussed vaccination: She has had COVID and flu shots.  I recommended she get the RSV vaccine.  Went over RSV biology.  Went over the vaccine.  She is going to do it in the fall.  Asthma Control Test ACT Total Score  02/12/2023  8:52 AM 24  02/08/2022 10:37 AM 25     Lab Results  Component Value Date   NITRICOXIDE 31 05/15/2018     CXR last was in 2013: And normal  No results found.    PFT      No data to display          Latest Reference Range & Units 06/03/08 06:07 01/03/11 15:57  Eosinophils Absolute 0.0 - 0.7 K/uL 0.0 0.2      has a past medical history of Anemia, Anxiety, Chronic allergic conjunctivitis, DDD (degenerative disc disease), lumbar, Depression, History of cervical polypectomy, History of concussion, History of uterine fibroid, Moderate persistent asthma without complication, Multiple chemical sensitivity syndrome, Perennial allergic rhinitis with seasonal variation, PMB (postmenopausal bleeding), and Submucous uterine fibroid.   reports that she has never smoked. She has never used smokeless tobacco.  Past Surgical History:  Procedure Laterality Date   BUNIONECTOMY Right 2009   CERVICAL CERCLAGE  x2     COLONOSCOPY WITH PROPOFOL N/A 07/12/2014   Procedure: COLONOSCOPY WITH PROPOFOL;  Surgeon: Charolett Bumpers, MD;  Location: [facility];  Service: Endoscopy;  Laterality: N/A;   DILATION AND CURETTAGE OF UTERUS  1973   DILITATION & CURRETTAGE/HYSTROSCOPY WITH VERSAPOINT RESECTION N/A 03/28/2015   Procedure: DILATATION & CURETTAGE/HYSTEROSCOPY WITH VERSAPOINT RESECTION;  Surgeon: Kirkland Hun, MD;  Location:  [facility];  Service: Gynecology;  Laterality: N/A;   NASAL SINUS SURGERY  1989   TUBAL LIGATION  1981    Allergies  Allergen Reactions   Amoxicillin Hives, Shortness Of Breath and Itching   Erythromycin Hives, Shortness Of Breath and Swelling   Latex Rash and Other (See Comments)    Skin gets irritated and itchy   Sulfa Antibiotics Hives, Shortness Of Breath and Swelling   Keflex [Cephalexin] Nausea And Vomiting    severe   Penicillins Hives    Childhood reaction     Immunization History  Administered Date(s) Administered   Fluad Quad(high Dose 65+) 04/09/2019, 06/06/2022   Influenza Split 07/30/2012, 06/19/2014   Influenza, High Dose Seasonal PF 04/19/2017, 04/15/2018,  04/20/2019   Influenza,inj,Quad PF,6+ Mos 06/07/2013, 06/01/2015, 04/23/2016   Influenza-Unspecified 05/19/2002, 08/01/2003, 03/27/2019, 06/05/2020   PFIZER(Purple Top)SARS-COV-2 Vaccination 11/02/2019, 11/23/2019, 06/11/2020   Pfizer Covid-19 Vaccine Bivalent Booster 7yrs & up 05/24/2021   Pneumococcal Conjugate-13 01/12/2015   Pneumococcal Polysaccharide-23 01/25/2016   Td 08/20/2010, 10/05/2018   Tdap 01/12/2015, 09/20/2018   Unspecified SARS-COV-2 Vaccination 11/02/2019, 11/23/2019, 06/11/2020, 11/17/2020   Zoster, Live 02/21/2015, 08/04/2020    Family History  Problem Relation Age of Onset   Heart disease Father    Cancer Mother        abdominal   Thyroid disease Sister        HYPO THYROID   Thyroid disease Daughter        HYPO THYROID     Current Outpatient Medications:    CALCIUM CITRATE PO, Take 500 mg by mouth daily., Disp: , Rfl:    Cholecalciferol (VITAMIN D3) 1000 UNITS CAPS, Take 1 capsule by mouth daily., Disp: , Rfl:    COVID-19 mRNA bivalent vaccine, Pfizer, (PFIZER COVID-19 VAC BIVALENT) injection, Inject into the muscle., Disp: 0.3 mL, Rfl: 0   Fexofenadine HCl (ALLEGRA PO), Take by mouth., Disp: , Rfl:    fluticasone furoate-vilanterol (BREO ELLIPTA) 100-25  MCG/ACT AEPB, Inhale 1 puff into the lungs daily., Disp: 60 each, Rfl: 11   influenza vaccine adjuvanted (FLUAD) 0.5 ML injection, Inject into the muscle., Disp: 0.5 mL, Rfl: 0   NON FORMULARY, Netipot, Disp: , Rfl:    albuterol (PROVENTIL HFA) 108 (90 Base) MCG/ACT inhaler, Inhale 1-2 puffs into the lungs every 6 (six) hours as needed for wheezing or shortness of breath., Disp: 18 g, Rfl: 3      Objective:   Vitals:   02/12/23 0849  BP: 110/70  Pulse: (!) 52  SpO2: 98%  Weight: 160 lb 3.2 oz (72.7 kg)  Height: 5\' 3"  (1.6 m)    Estimated body mass index is 28.38 kg/m as calculated from the following:   Height as of this encounter: 5\' 3"  (1.6 m).   Weight as of this encounter: 160 lb 3.2 oz (72.7 kg).  @WEIGHTCHANGE @  [employer]   02/12/23 0849  Weight: 160 lb 3.2 oz (72.7 kg)     Physical Exam   General: No distress. Looks well O2 at rest: no Cane present: no Sitting in wheel chair: no Frail: no Obese: no Neuro: Alert and Oriented x 3. GCS 15. Speech normal Psych: Pleasant Resp:  Barrel Chest - n.  Wheeze - Mild at base, Crackles - no, No overt respiratory distress CVS: Normal heart sounds. Murmurs - no Ext: Stigmata of Connective Tissue Disease - no HEENT: Normal upper airway. PEERL +. No post nasal drip        Assessment:       ICD-10-CM   1. Moderate persistent asthma without complication  J45.40     2. Vaccine counseling  Z71.85          Plan:     Patient Instructions  Moderate persistent asthma without complication  - stable without flare up other than spring 2024 with pollen/gardern exposure  - some wheeze but overall you are naturally working yourself out of the mild filare up - dong well  on breo  Plan -  - continue breo 1 puff twice daily scheduled  - do albuterol as neeed - respiclick version - 1 script for epi-pen if you want it 02/12/2023  =- cma to ensure refill for 1 year of above medicines - continue masking  when with clusters  and indoors - flu shot and RSV vaccine in fall - respect no cxr today 02/12/2023   Followup  12  months face to face      SIGNATURE    Dr. Kalman Shan, M.D., F.C.C.P,  Pulmonary and Critical Care Medicine Staff Physician, [facility] Director - Interstitial Lung Disease  Program  Pulmonary Fibrosis [facility] at [facility] [place], [place], [address]  Pager: [phone], If no answer or between  15:00h - 7:00h: call [phone] Telephone: [phone]  9:21 AM 02/12/2023   Moderate Complexity MDM OFFICE  2021 E/M guidelines, first released in 2021, with minor revisions added in 2023 and 2024 Must meet the requirements for 2 out of 3 dimensions to qualify.    Number and complexity of problems addressed Amount and/or complexity of data reviewed Risk of complications and/or morbidity  One or more chronic illness with mild exacerbation, OR progression, OR  side effects of treatment  Two or more stable chronic illnesses  One undiagnosed new problem with uncertain prognosis  One acute illness with systemic symptoms   One Acute complicated injury Must meet the requirements for 1 of 3 of the categories)  Category 1: Tests and documents, historian  Any combination of 3 of the following:  Assessment requiring an independent historian  Review of prior external note(s) from each unique source  Review of results of each unique test  Ordering of each unique test    Category 2: Interpretation of tests   Independent interpretation of a test performed by another physician/other qualified health care professional (not separately reported)  Category 3: Discuss management/tests  Discussion of management or test interpretation with external physician/other qualified health care professional/appropriate source (not separately reported) Moderate risk of morbidity from additional diagnostic testing or treatment Examples  only:  Prescription drug management  Decision regarding minor surgery with identfied patient or procedure risk factors  Decision regarding elective major surgery without identified patient or procedure risk factors  Diagnosis or treatment significantly limited by social determinants of health             HIGh Complexity  OFFICE   2021 E/M guidelines, first released in 2021, with minor revisions added in 2023. Must meet the requirements for 2 out of 3 dimensions to qualify.    Number and complexity of problems addressed Amount and/or complexity of data reviewed Risk of complications and/or morbidity  Severe exacerbation of chronic illness  Acute or chronic illnesses that may pose a threat to life or bodily function, e.g., multiple trauma, acute MI, pulmonary embolus, severe respiratory distress, progressive rheumatoid arthritis, psychiatric illness with potential threat to self or others, peritonitis, acute renal failure, abrupt change in neurological status Must meet the requirements for 2 of 3 of the categories)  Category 1: Tests and documents, historian  Any combination of 3 of the following:  Assessment requiring an independent historian  Review of prior external note(s) from each unique source  Review of results of each unique test  Ordering of each unique test    Category 2: Interpretation of tests    Independent interpretation of a test performed by another physician/other qualified health care professional (not separately reported)  Category 3: Discuss management/tests  Discussion of management or test interpretation with external physician/other qualified health care professional/appropriate source (not separately reported)  HIGH risk of morbidity from additional diagnostic testing or treatment Examples only:  Drug therapy requiring  intensive monitoring for toxicity  Decision for elective major surgery with identified pateint or procedure risk  factors  Decision regarding hospitalization or escalation of level of care  Decision for DNR or to de-escalate care   Parenteral controlled  substances            LEGEND - Independent interpretation involves the interpretation of a test for which there is a CPT code, and an interpretation or report is customary. When a review and interpretation of a test is performed and documented by the provider, but not separately reported (billed), then this would represent an independent interpretation. This report does not need to conform to the usual standards of a complete report of the test. This does not include interpretation of tests that do not have formal reports such as a complete blood count with differential and blood cultures. Examples would include reviewing a chest radiograph and documenting in the medical record an interpretation, but not separately reporting (billing) the interpretation of the chest radiograph.   An appropriate source includes professionals who are not health care professionals but may be involved in the management of the patient, such as a Clinical research associate, upper officer, case manager or teacher, and does not include discussion with family or informal caregivers.    - SDOH: SDOH are the conditions in the environments where people are born, live, learn, work, play, worship, and age that affect a wide range of health, functioning, and quality-of-life outcomes and risks. (e.g., housing, food insecurity, transportation, etc.). SDOH-related Z codes ranging from Z55-Z65 are the ICD-10-CM diagnosis codes used to document SDOH data Z55 - Problems related to education and literacy Z56 - Problems related to employment and unemployment Z57 - Occupational exposure to risk factors Z58 - Problems related to physical environment Z59 - Problems related to housing and economic circumstances [phone] - Problems related to social environment [phone] - Problems related to upbringing [phone] - Other  problems related to primary support group, including family circumstances Z38 - Problems related to certain psychosocial circumstances Z65 - Problems related to other psychosocial circumstances

## 2023-02-12 NOTE — Telephone Encounter (Signed)
PT said only one of her meds was called into the Pharm. May be because it was mentioned at the end of her appt. It was for an Epi-pen. She would like to pick up along with her other medication.  Pharm is [facility] at [facility]  Her # is [phone]

## 2023-02-13 MED ORDER — EPINEPHRINE 0.3 MG/0.3ML IJ SOAJ: 0.3000 mg | Freq: Once | INTRAMUSCULAR | 5 refills | Status: AC

## 2023-02-13 NOTE — Telephone Encounter (Signed)
Pt calling to get Epi-Pen prescription sent into [facility] on [address]

## 2023-02-13 NOTE — Telephone Encounter (Signed)
Called and spoke with patient. She was calling to have her Epi-Pen prescription sent to [facility] on [address]. I advised her that I would go ahead and send in the RX for her. She verbalized understanding.   Nothing further needed at time of call.

## 2023-02-13 NOTE — Telephone Encounter (Signed)
Pt calling back to get a prescription sent in for her Epi-Pen

## 2023-03-03 ENCOUNTER — Other Ambulatory Visit: Payer: Self-pay | Admitting: Internal Medicine

## 2023-04-10 IMAGING — US US BREAST*L* LIMITED INC AXILLA
1 series · 2 of 2 positions shown · non-contrast
Comparison: Previous exam(s).

CLINICAL DATA: Palpable mass in the LEFT breast first noted 4 weeks
ago. The mass has gotten smaller patient is unable to feel a mass
today.

EXAM:
DIGITAL DIAGNOSTIC BILATERAL MAMMOGRAM WITH TOMOSYNTHESIS AND CAD;
ULTRASOUND LEFT BREAST LIMITED
TECHNIQUE: Bilateral digital diagnostic mammography and breast tomosynthesis
was performed. The images were evaluated with computer-aided
detection.; Targeted ultrasound examination of the left breast was
performed.

[Series 1: us breast*left* limited inc axilla · 0.07mm/px · 2 of 2 slices shown]
[im 1/2]
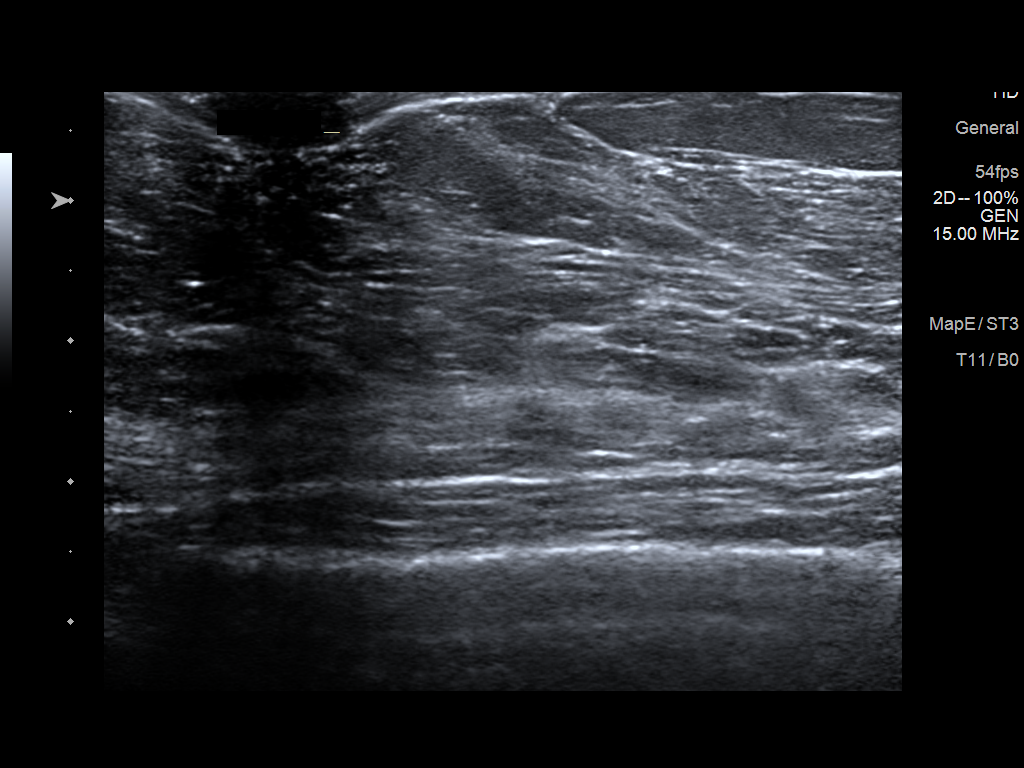
[im 2/2]
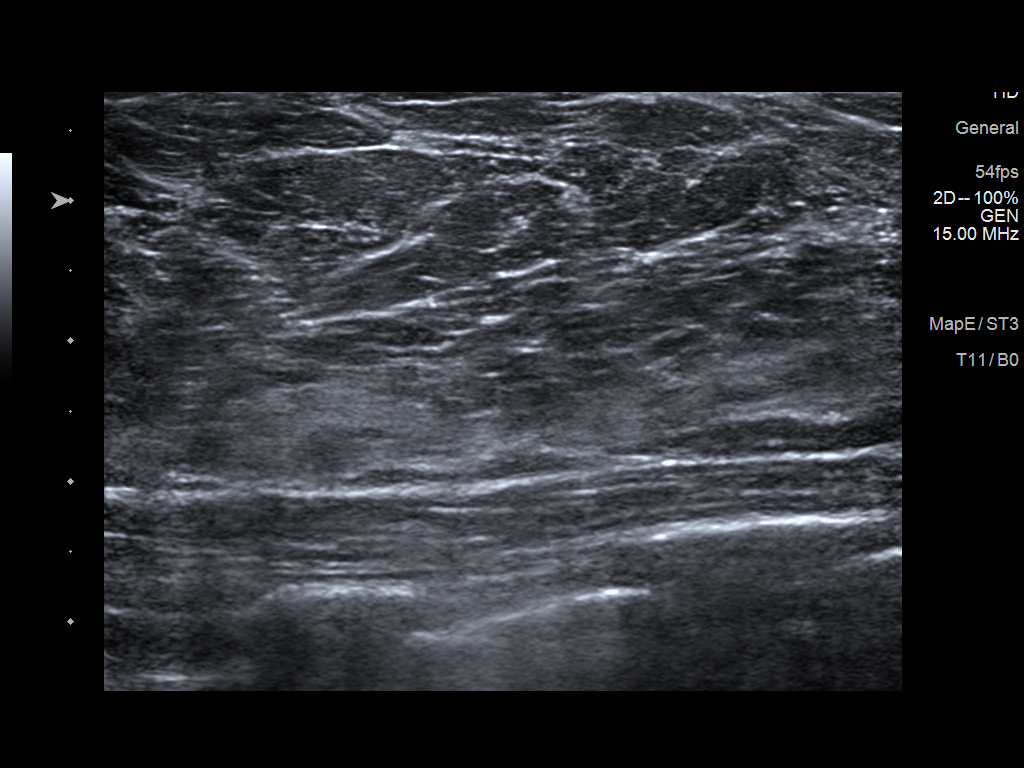

[2 of 2 positions shown; findings below may reference images not displayed]

ACR Breast Density Category b: There are scattered areas of
fibroglandular density.
FINDINGS: No suspicious mass, distortion, or microcalcifications are
identified to suggest presence of malignancy. Spot tangential view
in the retroareolar region of the LEFT breast is unremarkable.

On physical exam, I palpate soft areas of thickening in 1-2 o'clock
location of the retroareolar region of the LEFT breast. I palpate no
discrete mass in this region.

.

Targeted ultrasound is performed, showing normal appearing
fibroglandular tissue in the retroareolar region of the LEFT breast.
No suspicious mass, distortion, or acoustic shadowing is
demonstrated with ultrasound.
IMPRESSION: No mammographic or ultrasound evidence for malignancy.

RECOMMENDATION:
Screening mammogram in one year.(Code:[id number])

I have discussed the findings and recommendations with the patient.
If applicable, a reminder letter will be sent to the patient
regarding the next appointment.

BI-RADS CATEGORY  1: Negative.

## 2023-04-10 IMAGING — MG DIGITAL DIAGNOSTIC BILAT W/ TOMO W/ CAD
6 of 10 series · 6 of 30 positions shown · non-contrast
Comparison: Previous exam(s).

CLINICAL DATA: Palpable mass in the LEFT breast first noted 4 weeks
ago. The mass has gotten smaller patient is unable to feel a mass
today.

EXAM:
DIGITAL DIAGNOSTIC BILATERAL MAMMOGRAM WITH TOMOSYNTHESIS AND CAD;
ULTRASOUND LEFT BREAST LIMITED
TECHNIQUE: Bilateral digital diagnostic mammography and breast tomosynthesis
was performed. The images were evaluated with computer-aided
detection.; Targeted ultrasound examination of the left breast was
performed.

[L CC synth-2D (1 of 2)]
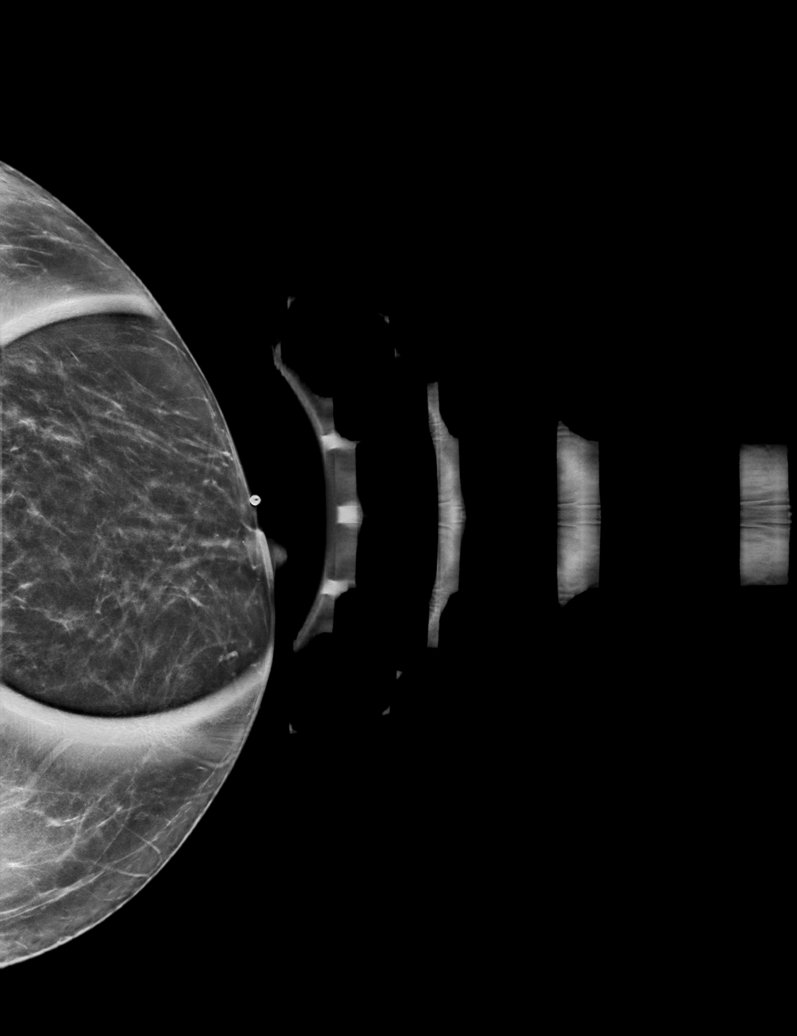

[L MLO synth-2D]
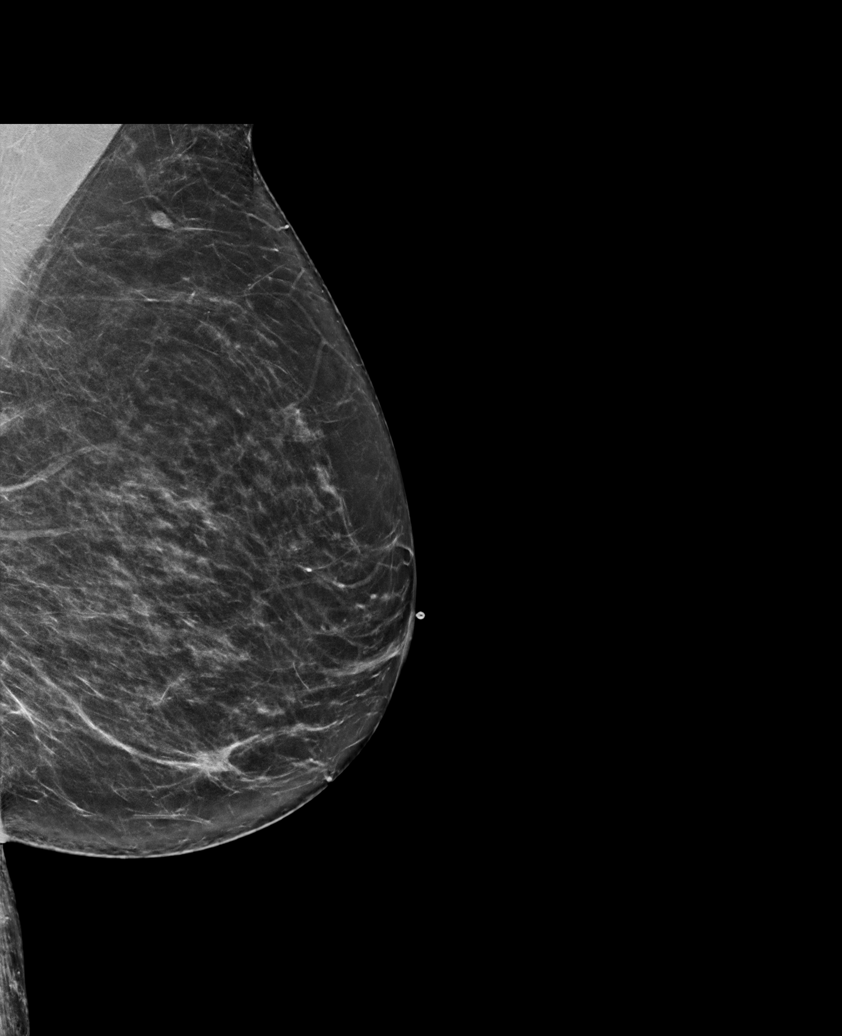

[R MLO synth-2D]
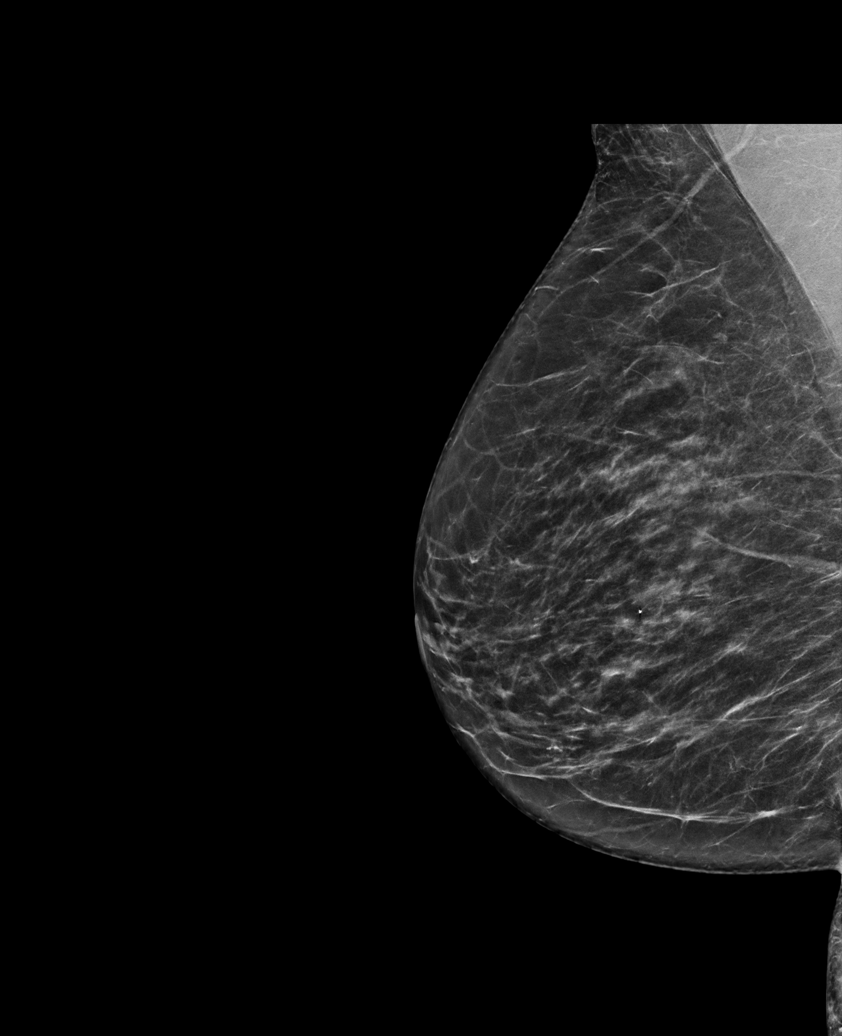

[R CC synth-2D]
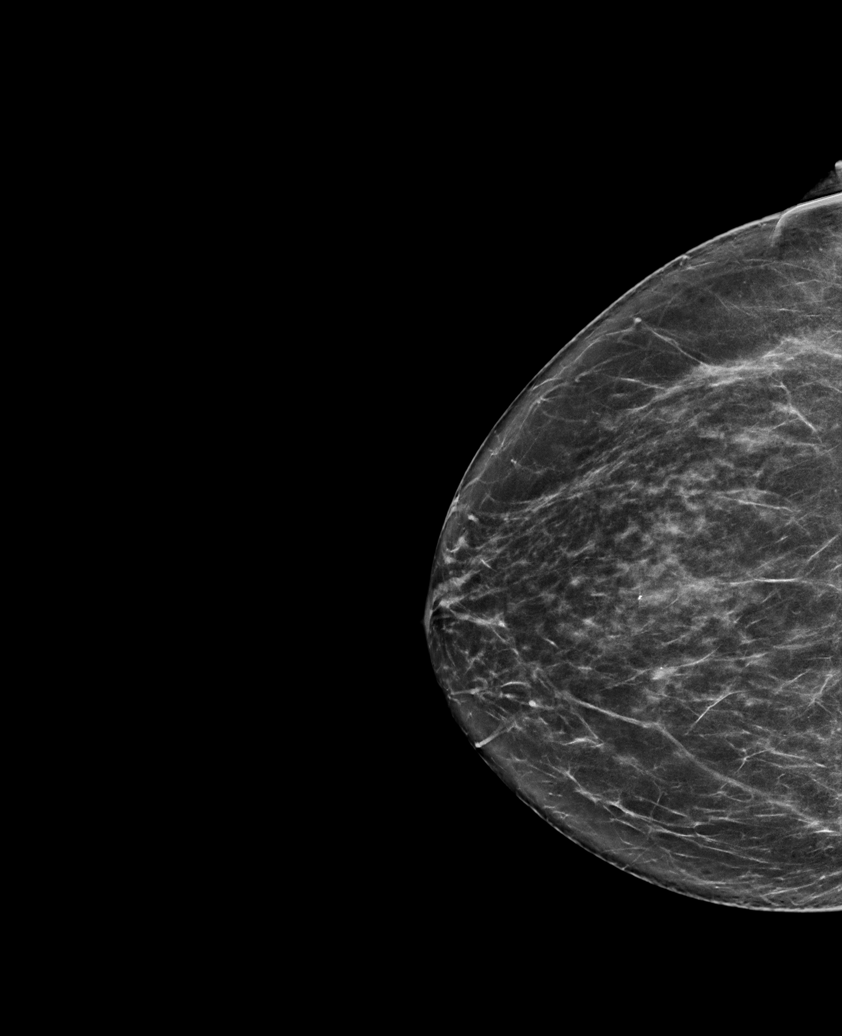

[L CC synth-2D (2 of 2)]
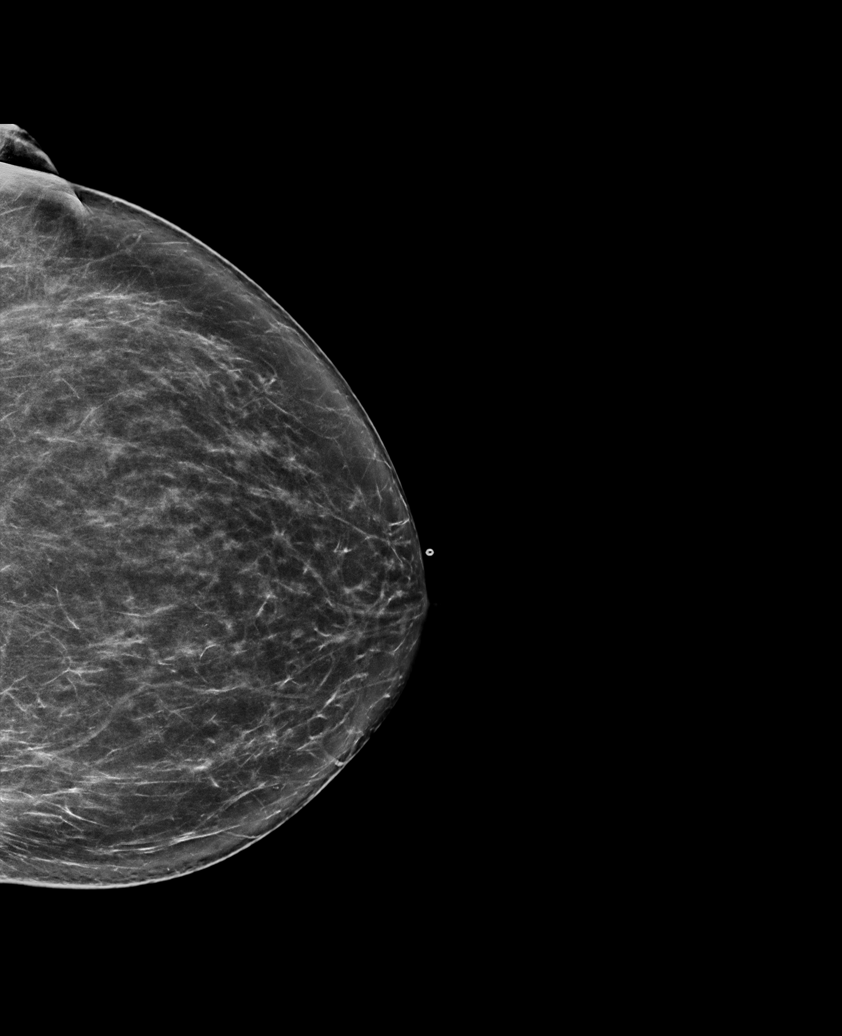

[L MLO tomo · tomo slice 37/74.0]
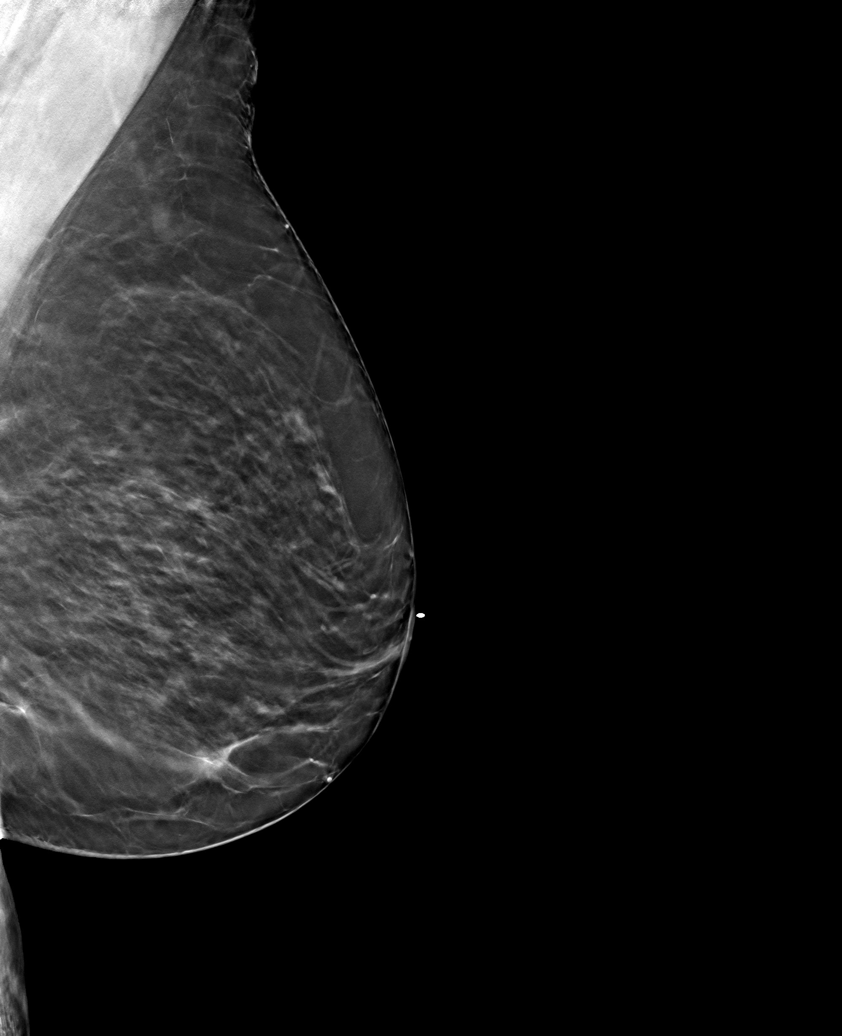

[6 of 30 positions shown; findings below may reference images not displayed]

ACR Breast Density Category b: There are scattered areas of
fibroglandular density.
FINDINGS: No suspicious mass, distortion, or microcalcifications are
identified to suggest presence of malignancy. Spot tangential view
in the retroareolar region of the LEFT breast is unremarkable.

On physical exam, I palpate soft areas of thickening in 1-2 o'clock
location of the retroareolar region of the LEFT breast. I palpate no
discrete mass in this region.

.

Targeted ultrasound is performed, showing normal appearing
fibroglandular tissue in the retroareolar region of the LEFT breast.
No suspicious mass, distortion, or acoustic shadowing is
demonstrated with ultrasound.
IMPRESSION: No mammographic or ultrasound evidence for malignancy.

RECOMMENDATION:
Screening mammogram in one year.(Code:[id number])

I have discussed the findings and recommendations with the patient.
If applicable, a reminder letter will be sent to the patient
regarding the next appointment.

BI-RADS CATEGORY  1: Negative.

## 2023-08-01 ENCOUNTER — Ambulatory Visit
Admission: RE | Admit: 2023-08-01 | Discharge: 2023-08-01 | Disposition: A | Discharge status: Home / Self Care | Payer: Medicare Other | Source: Ambulatory Visit | Attending: Family Medicine | Admitting: Family Medicine

## 2023-08-01 DIAGNOSIS — Z780 Asymptomatic menopausal state: Secondary | ICD-10-CM

## 2023-08-04 ENCOUNTER — Ambulatory Visit
Admission: RE | Admit: 2023-08-04 | Discharge: 2023-08-04 | Disposition: A | Discharge status: Home / Self Care | Payer: Medicare Other | Source: Ambulatory Visit | Attending: Family Medicine | Admitting: Family Medicine

## 2023-08-04 ENCOUNTER — Other Ambulatory Visit: Payer: Self-pay | Admitting: Family Medicine

## 2023-08-04 DIAGNOSIS — M545 Low back pain, unspecified: Secondary | ICD-10-CM

## 2023-08-04 DIAGNOSIS — M25551 Pain in right hip: Secondary | ICD-10-CM

## 2023-09-25 ENCOUNTER — Telehealth (INDEPENDENT_AMBULATORY_CARE_PROVIDER_SITE_OTHER): Payer: Self-pay | Admitting: Otolaryngology

## 2023-09-25 ENCOUNTER — Other Ambulatory Visit: Payer: Self-pay | Admitting: Internal Medicine

## 2023-09-25 NOTE — Telephone Encounter (Signed)
 Called and left vm to confirm appt and address for 09/26/2023

## 2023-09-26 ENCOUNTER — Ambulatory Visit (INDEPENDENT_AMBULATORY_CARE_PROVIDER_SITE_OTHER): Payer: Medicare Other

## 2023-09-26 ENCOUNTER — Encounter (INDEPENDENT_AMBULATORY_CARE_PROVIDER_SITE_OTHER): Payer: Self-pay

## 2023-09-26 VITALS — BP 138/81 | HR 64 | Ht 62.0 in | Wt 150.0 lb

## 2023-09-26 DIAGNOSIS — J343 Hypertrophy of nasal turbinates: Secondary | ICD-10-CM

## 2023-09-26 DIAGNOSIS — J310 Chronic rhinitis: Secondary | ICD-10-CM

## 2023-09-26 DIAGNOSIS — R0981 Nasal congestion: Secondary | ICD-10-CM

## 2023-09-26 DIAGNOSIS — J338 Other polyp of sinus: Secondary | ICD-10-CM

## 2023-09-26 DIAGNOSIS — H6123 Impacted cerumen, bilateral: Secondary | ICD-10-CM

## 2023-09-27 DIAGNOSIS — J343 Hypertrophy of nasal turbinates: Secondary | ICD-10-CM | POA: Insufficient documentation

## 2023-09-27 DIAGNOSIS — H6123 Impacted cerumen, bilateral: Secondary | ICD-10-CM | POA: Insufficient documentation

## 2023-09-27 DIAGNOSIS — J338 Other polyp of sinus: Secondary | ICD-10-CM | POA: Insufficient documentation

## 2023-09-27 DIAGNOSIS — J310 Chronic rhinitis: Secondary | ICD-10-CM | POA: Insufficient documentation

## 2023-09-27 NOTE — Progress Notes (Signed)
 Patient ID: Catherine Chambers, female   DOB: 10/02/1945, 78 y.o.   MRN: [id number]  Follow-up: Chronic nasal congestion, nasal polyps, recurrent cerumen impaction  HPI: The patient is a 78 year old female who returns today for her follow-up evaluation.  The patient was last seen 6 months ago.  At that time, she was complaining of nasal congestion and clogging sensation in her ears. The patient has a history of chronic rhinosinusitis and polyposis.  She underwent bilateral endoscopic sinus surgery in the 80s.  At her last visit, she was noted to have nasal mucosal congestion, bilateral inferior turbinate hypertrophy, and bilateral sinonasal polyps.  She was treated with Rhinocort  nasal spray.  In addition, she was also noted to have recurrent cerumen impaction.  She underwent the disimpaction procedure without difficulty.  The patient returns today reporting slight improvement in her nasal congestion.  The nasal spray has helped with her nasal congestion.  She has noted recurrent clogging sensation in her ears. She denies any significant facial pain, fever, visual change, or otorrhea.  Exam: General: Communicates without difficulty, well nourished, no acute distress. Head: Normocephalic, no evidence injury, no tenderness, facial buttresses intact without stepoff. Face/sinus: No tenderness to palpation and percussion. Facial movement is normal and symmetric. Eyes: PERRL, EOMI. No scleral icterus, conjunctivae clear. Neuro: CN II exam reveals vision grossly intact.  No nystagmus at any point of gaze. Ears: Auricles well formed without lesions.  EAC: Bilateral cerumen impaction.  Under the operating microscope, the cerumen is carefully removed with a combination of cerumen currette, alligator forceps, and suction catheters.  After the cerumen is removed, the TMs are noted to be normal.  Nose: External evaluation reveals normal support and skin without lesions.  Dorsum is intact.  Anterior rhinoscopy reveals  congested mucosa over anterior aspect of inferior turbinates and intact septum.  Bilateral nasal polyps.  Oral:  Oral cavity and oropharynx are intact, symmetric, without erythema or edema.  Mucosa is moist without lesions. Neck: Full range of motion without pain.  There is no significant lymphadenopathy.  No masses palpable.  Thyroid bed within normal limits to palpation.  Parotid glands and submandibular glands equal bilaterally without mass.  Trachea is midline. Neuro:  CN 2-12 grossly intact.   Procedure: Bilateral cerumen disimpaction Anesthesia: None Description: Under the operating microscope, the cerumen is carefully removed with a combination of cerumen currette, alligator forceps, and suction catheters.  After the cerumen is removed, the TMs are noted to be normal.  No mass, erythema, or lesions. The patient tolerated the procedure well.    Assessment: 1.  Chronic rhinitis with nasal mucosal congestion, bilateral inferior turbinate hypertrophy, and bilateral sinonasal polyps.  Her symptoms are currently under control with the use of Rhinocort  nasal spray. 2.  Bilateral cerumen impaction.  After the disimpaction procedure, both tympanic membranes and middle ear spaces are noted to be normal.  Plan: 1.  The physical exam findings are reviewed with the patient. 2.  Otomicroscopy with bilateral cerumen disimpaction. 3.  Continue with Rhinocort  nasal spray daily. 4.  The patient will return for reevaluation in 6 months.

## 2023-12-01 ENCOUNTER — Other Ambulatory Visit: Payer: Self-pay | Admitting: Family Medicine

## 2023-12-01 DIAGNOSIS — Z1231 Encounter for screening mammogram for malignant neoplasm of breast: Secondary | ICD-10-CM

## 2024-02-13 ENCOUNTER — Ambulatory Visit
Admission: RE | Admit: 2024-02-13 | Discharge: 2024-02-13 | Disposition: A | Discharge status: Home / Self Care | Source: Ambulatory Visit | Attending: Family Medicine | Admitting: Family Medicine

## 2024-02-13 DIAGNOSIS — Z1231 Encounter for screening mammogram for malignant neoplasm of breast: Secondary | ICD-10-CM

## 2024-02-17 ENCOUNTER — Ambulatory Visit: Admitting: Internal Medicine

## 2024-02-18 ENCOUNTER — Ambulatory Visit
Admission: RE | Admit: 2024-02-18 | Discharge: 2024-02-18 | Disposition: A | Discharge status: Home / Self Care | Source: Ambulatory Visit | Attending: Family Medicine | Admitting: Family Medicine

## 2024-02-18 ENCOUNTER — Other Ambulatory Visit: Payer: Self-pay | Admitting: Family Medicine

## 2024-02-18 DIAGNOSIS — R928 Other abnormal and inconclusive findings on diagnostic imaging of breast: Secondary | ICD-10-CM

## 2024-03-01 ENCOUNTER — Other Ambulatory Visit

## 2024-03-01 ENCOUNTER — Encounter

## 2024-03-09 NOTE — Progress Notes (Unsigned)
 02/08/2022 -   Chief Complaint  Patient presents with   Follow-up    Pt states no SOB, cough or wheeze, breathing is good   Follow-up moderate persistent asthma.  On Symbicort   HPI Catherine Chambers 78 y.o. -returns for follow-up.  She actually called into the office saying that medical issues like panic attack and anxiety showed up on the records.  Apparently this happened after she visited me last time.  Other physicians have denied that they were responsible for this.  She was told by a physician that somewhere along the way the medical records pulled in different information.  She is not sure who did it but it did correlate with an office visit to our visit office.  I personally am not given at this diagnosis.  I reviewed the medical problems it is listed that his panic attacks and anxiety.  I have offered to delete this and actually deleted it.  In terms of asthma she is stable.  She says she masks with clusters and so far she has not gotten COVID.  She believes and masking.  She continues her Symbicort .  After the spring season she will reduce the intensity of Symbicort .  But otherwise she takes 2 puffs 2 times daily.  There are no new issues.  She feels her asthma is under excellent control.  She wants refills on her medications   OV 02/12/2023  Subjective:  Patient ID: Catherine Chambers, female , DOB: 05-Nov-1945 , age 69 y.o. , MRN: [id number] , ADDRESS: [address] [place] [address] PCP Windy Coy, MD Patient Care Team: Windy Coy, MD as PCP - General (Family Medicine)  This Provider for this visit: Treatment Team:  Attending Provider: Geronimo Amel, MD    02/12/2023 -   Chief Complaint  Patient presents with   Follow-up    Annual f/up no complaints     HPI Catherine Chambers 78 y.o. -returns for annual asthma follow-up.  She says she been doing well overall.  No new medical problems no emergency room visits no urgent care  visits no prednisone 's no surgeries.  However in the spring 2024 with gardening and exposure to pollen and dust she had significant cough but she says she has worked herself out.  She does not like to do prednisone .  Symptoms have improved spontaneously except for some mild wheezing that she can feel.  She is not interested in prednisone  at the moment.  She is not interested in chest x-ray today.  Last chest x-ray was in 2013.  Of note she is currently on Breo which which is working well for her.  She finds it more convenient.  She feels she was running out of control after years of being on Symbicort .  We discussed vaccination: She has had COVID and flu shots.  I recommended she get the RSV vaccine.  Went over RSV biology.  Went over the vaccine.  She is going to do it in the fall.   OV 03/10/2024  Subjective:  Patient ID: Catherine Chambers, female , DOB: 04/11/1946 , age 65 y.o. , MRN: [id number] , ADDRESS: [address] [place] [address] PCP Windy Coy, MD Patient Care Team: Windy Coy, MD as PCP - General (Family Medicine)  This Provider for this visit: Treatment Team:  Attending Provider: Geronimo Amel, MD    03/10/2024 -   Chief Complaint  Patient presents with   Medical Management of Chronic Issues  Increased sinus congestion. She is coughing some, prod with large amounts of yellow sputum, getting more light in color over the past 2 days. Her ears are stuffy.    Asthma     HPI Catherine Chambers 78 y.o. -p.o.  She does not fail taking it.  She has not had a viral infection in 5 years she has not used albuterol  in several years.  She has never had COVID.  This is because of her diligence.Interim Health status: No new complaints No new medical problems. No new surgeries. No ER visits. No Urgent care visits. No changes to medications   Is a 1 year follow-up.        LAB RESULTS last 96 hours No results found.       has a past medical  history of Anemia, Anxiety, Chronic allergic conjunctivitis, DDD (degenerative disc disease), lumbar, Depression, History of cervical polypectomy, History of concussion, History of uterine fibroid, Moderate persistent asthma without complication, Multiple chemical sensitivity syndrome, Perennial allergic rhinitis with seasonal variation, PMB (postmenopausal bleeding), and Submucous uterine fibroid.   reports that she has never smoked. She has never used smokeless tobacco.  Past Surgical History:  Procedure Laterality Date   BUNIONECTOMY Right 2009   CERVICAL CERCLAGE  x2     COLONOSCOPY WITH PROPOFOL  N/A 07/12/2014   Procedure: COLONOSCOPY WITH PROPOFOL ;  Surgeon: Gladis MARLA Louder, MD;  Location: [facility];  Service: Endoscopy;  Laterality: N/A;   DILATION AND CURETTAGE OF UTERUS  1973   DILITATION & CURRETTAGE/HYSTROSCOPY WITH VERSAPOINT RESECTION N/A 03/28/2015   Procedure: DILATATION & CURETTAGE/HYSTEROSCOPY WITH VERSAPOINT RESECTION;  Surgeon: Rome Rigg, MD;  Location[facility];  Service: Gynecology;  Laterality: N/A;   NASAL SINUS SURGERY  1989   TUBAL LIGATION  1981    Allergies  Allergen Reactions   Amoxicillin Hives, Shortness Of Breath and Itching   Erythromycin Hives, Shortness Of Breath and Swelling   Latex Rash and Other (See Comments)    Skin gets irritated and itchy   Sulfa Antibiotics Hives, Shortness Of Breath and Swelling   Almond (Diagnostic) Itching and Swelling   Keflex [Cephalexin] Nausea And Vomiting    severe   Penicillins Hives    Childhood reaction     Immunization History  Administered Date(s) Administered   Fluad Quad(high Dose 65+) 04/09/2019, 06/06/2022   Influenza Split 07/30/2012, 06/19/2014   Influenza, High Dose Seasonal PF 04/19/2017, 04/15/2018, 04/20/2019   Influenza,inj,Quad PF,6+ Mos 06/07/2013, 06/01/2015, 04/23/2016   Influenza-Unspecified 05/19/2002, 08/01/2003, 03/27/2019, 06/05/2020   PFIZER(Purple  Top)SARS-COV-2 Vaccination 11/02/2019, 11/23/2019, 06/11/2020   Pfizer Covid-19 Vaccine Bivalent Booster 37yrs & up 05/24/2021   Pneumococcal Conjugate-13 01/12/2015   Pneumococcal Polysaccharide-23 01/25/2016   Td 08/20/2010, 10/05/2018   Tdap 01/12/2015, 09/20/2018   Unspecified SARS-COV-2 Vaccination 11/02/2019, 11/23/2019, 06/11/2020, 11/17/2020   Zoster, Live 02/21/2015, 08/04/2020    Family History  Problem Relation Age of Onset   Cancer Mother        abdominal   Heart disease Father    Thyroid disease Sister        HYPO THYROID   Thyroid disease Daughter        HYPO THYROID   Clotting disorder Daughter      Current Outpatient Medications:    CALCIUM CITRATE PO, Take 500 mg by mouth daily., Disp: , Rfl:    Cholecalciferol (VITAMIN D3) 1000 UNITS CAPS, Take 1 capsule by mouth daily., Disp: , Rfl:    EPINEPHrine  0.3 mg/0.3 mL IJ SOAJ  injection, Inject 0.3 mg into the muscle once for 1 dose., Disp: 1 each, Rfl: 0   Fexofenadine HCl (ALLEGRA PO), Take by mouth., Disp: , Rfl:    ibuprofen  (ADVIL ) 200 MG tablet, Take 200 mg by mouth every 6 (six) hours as needed for mild pain (pain score 1-3)., Disp: , Rfl:    NON FORMULARY, Netipot, Disp: , Rfl:    albuterol  (PROVENTIL  HFA) 108 (90 Base) MCG/ACT inhaler, Inhale 1-2 puffs into the lungs every 6 (six) hours as needed for wheezing or shortness of breath., Disp: 18 g, Rfl: 3   COVID-19 mRNA bivalent vaccine, Pfizer, (PFIZER COVID-19 VAC BIVALENT) injection, Inject into the muscle., Disp: 0.3 mL, Rfl: 0   fluticasone  furoate-vilanterol (BREO ELLIPTA ) 100-25 MCG/ACT AEPB, Inhale 1 puff into the lungs daily., Disp: 60 each, Rfl: 11   influenza vaccine adjuvanted (FLUAD) 0.5 ML injection, Inject into the muscle., Disp: 0.5 mL, Rfl: 0      Objective:   Vitals:   03/10/24 0929 03/10/24 1000  BP: (!) 146/84 136/80  Pulse: 63   SpO2: 95%   Weight: 160 lb 6.4 oz (72.8 kg)   Height: 5' 2 (1.575 m)     Estimated body mass index is  29.34 kg/m as calculated from the following:   Height as of this encounter: 5' 2 (1.575 m).   Weight as of this encounter: 160 lb 6.4 oz (72.8 kg).  @WEIGHTCHANGE @  [employer]   03/10/24 0929  Weight: 160 lb 6.4 oz (72.8 kg)     Physical Exam   General: No distress. Looks well O2 at rest: no Cane present: no Sitting in wheel chair: no Frail: no Obese: no Neuro: Alert and Oriented x 3. GCS 15. Speech normal Psych: Pleasant Resp:  Barrel Chest - no.  Wheeze - no, Crackles - no, No overt respiratory distress CVS: Normal heart sounds. Murmurs - no Ext: Stigmata of Connective Tissue Disease - no HEENT: Normal upper airway. PEERL +. No post nasal drip        Assessment/PLAN     Assessment & Plan Moderate persistent asthma without complication    Patient Instructions  Moderate persistent asthma without complication  - stable without flare up other than spring 2024 with pollen/gardern exposure - dong well  on breo - last albuterol  use few years ago  Plan -  continue breo 1 puff twice daily scheduled    = CMA to refill - continue albuterol  as neeed - respiclick version  - CMA to refill - keep epi-pen for PRN use  =- cma to ensure refill   - continue masking when with clusters and indoors - Take Coupon for AIRSUPRA  - if you cannot afford it you can try As needed BREO/Albuterol  use  - if you can aford Airsupra call us  for making a script   Followup  12  months face to face     FOLLOWUP    Return in about 1 year (around 03/10/2025) for with any of the APPS, with Dr Geronimo.    SIGNATURE    Dr. Dorethia Geronimo, M.D., F.C.C.P,  Pulmonary and Critical Care Medicine Staff Physician, [facility] Director - Interstitial Lung Disease  Program  Pulmonary Fibrosis [facility] at [facility] [place], [place], [address]  Pager: [phone], If no answer or between  15:00h - 7:00h: call [phone] Telephone: [phone]  6:40 PM 03/10/2024

## 2024-03-10 ENCOUNTER — Ambulatory Visit: Admitting: Internal Medicine

## 2024-03-10 ENCOUNTER — Encounter: Payer: Self-pay | Admitting: Internal Medicine

## 2024-03-10 VITALS — BP 136/80 | HR 63 | Ht 62.0 in | Wt 160.4 lb

## 2024-03-10 DIAGNOSIS — J4540 Moderate persistent asthma, uncomplicated: Secondary | ICD-10-CM

## 2024-03-10 MED ORDER — ALBUTEROL SULFATE HFA 108 (90 BASE) MCG/ACT IN AERS: 1.0000 | INHALATION_SPRAY | Freq: Four times a day (QID) | RESPIRATORY_TRACT | 3 refills | Status: AC | PRN

## 2024-03-10 MED ORDER — FLUTICASONE FUROATE-VILANTEROL 100-25 MCG/ACT IN AEPB: 1.0000 | INHALATION_SPRAY | Freq: Every day | RESPIRATORY_TRACT | 11 refills | Status: AC

## 2024-03-10 MED ORDER — EPINEPHRINE 0.3 MG/0.3ML IJ SOAJ: 0.3000 mg | Freq: Once | INTRAMUSCULAR | 0 refills | Status: AC

## 2024-03-10 NOTE — Patient Instructions (Addendum)
 Moderate persistent asthma without complication  - stable without flare up other than spring 2024 with pollen/gardern exposure - dong well  on breo - last albuterol  use few years ago  Plan -  continue breo 1 puff twice daily scheduled    = CMA to refill - continue albuterol  as neeed - respiclick version  - CMA to refill - keep epi-pen for PRN use  =- cma to ensure refill   - continue masking when with clusters and indoors - Take Coupon for AIRSUPRA  - if you cannot afford it you can try As needed BREO/Albuterol  use  - if you can aford Airsupra call us  for making a script   Followup  12  months face to face

## 2024-03-19 ENCOUNTER — Telehealth (INDEPENDENT_AMBULATORY_CARE_PROVIDER_SITE_OTHER): Payer: Self-pay | Admitting: Otolaryngology

## 2024-03-19 NOTE — Telephone Encounter (Signed)
 Patient would like to get a Rx for Nasonex nasal spray.  [facility] at [address], [place].  She can be reached at [phone].

## 2024-03-22 ENCOUNTER — Other Ambulatory Visit (INDEPENDENT_AMBULATORY_CARE_PROVIDER_SITE_OTHER): Payer: Self-pay | Admitting: Otolaryngology

## 2024-03-22 MED ORDER — MOMETASONE FUROATE 50 MCG/ACT NA SUSP: 2.0000 | Freq: Every day | NASAL | 12 refills | Status: AC

## 2024-04-09 ENCOUNTER — Encounter (INDEPENDENT_AMBULATORY_CARE_PROVIDER_SITE_OTHER): Payer: Self-pay | Admitting: Otolaryngology

## 2024-04-09 ENCOUNTER — Ambulatory Visit (INDEPENDENT_AMBULATORY_CARE_PROVIDER_SITE_OTHER): Payer: 59 | Admitting: Otolaryngology

## 2024-04-09 VITALS — BP 134/81 | HR 62

## 2024-04-09 DIAGNOSIS — J310 Chronic rhinitis: Secondary | ICD-10-CM

## 2024-04-09 DIAGNOSIS — H6123 Impacted cerumen, bilateral: Secondary | ICD-10-CM | POA: Diagnosis not present

## 2024-04-09 DIAGNOSIS — J343 Hypertrophy of nasal turbinates: Secondary | ICD-10-CM

## 2024-04-09 DIAGNOSIS — R0981 Nasal congestion: Secondary | ICD-10-CM

## 2024-04-09 DIAGNOSIS — J338 Other polyp of sinus: Secondary | ICD-10-CM

## 2024-04-09 NOTE — Progress Notes (Signed)
 Patient ID: Catherine Chambers, female   DOB: 12/24/1945, 78 y.o.   MRN: [id number]  Follow-up: Chronic nasal congestion, nasal polyps   HPI: The patient is a 78 year old female who returns today for her follow-up evaluation.  The patient has a history of chronic rhinosinusitis and polyposis.  She underwent bilateral endoscopic sinus surgery in the 1980s.  At her last visit in February 2025, she was noted to have nasal mucosal congestion, bilateral inferior turbinate hypertrophy, and bilateral sinonasal polyps.  Her symptoms were controlled with steroid nasal sprays.  The patient returns today complaining of an episode of sinusitis in May 2025.  The acute infection has since resolved.  She is currently on Nasonex  nasal spray and Allegra daily.  Exam: General: Communicates without difficulty, well nourished, no acute distress. Head: Normocephalic, no evidence injury, no tenderness, facial buttresses intact without stepoff. Face/sinus: No tenderness to palpation and percussion. Facial movement is normal and symmetric. Eyes: PERRL, EOMI. No scleral icterus, conjunctivae clear. Neuro: CN II exam reveals vision grossly intact.  No nystagmus at any point of gaze. Ears: Auricles well formed without lesions.  EAC: Bilateral cerumen impaction.  Nose: External evaluation reveals normal support and skin without lesions.  Dorsum is intact.  Anterior rhinoscopy reveals congested mucosa over anterior aspect of inferior turbinates and intact septum.  Bilateral nasal polyps.  Oral:  Oral cavity and oropharynx are intact, symmetric, without erythema or edema.  Mucosa is moist without lesions. Neck: Full range of motion without pain.  There is no significant lymphadenopathy.  No masses palpable.  Thyroid bed within normal limits to palpation.  Parotid glands and submandibular glands equal bilaterally without mass.  Trachea is midline. Neuro:  CN 2-12 grossly intact.    Procedure: Bilateral cerumen  disimpaction Anesthesia: None Description: Under the operating microscope, the cerumen is carefully removed with a combination of cerumen currette, alligator forceps, and suction catheters.  After the cerumen is removed, the TMs are noted to be normal.  No mass, erythema, or lesions. The patient tolerated the procedure well.     Assessment: 1.  Bilateral recurrent rhinosinusitis, with nasal mucosal congestion, bilateral inferior turbinate hypertrophy, and bilateral sinonasal polyps.  Her most recent acute infection has resolved. 2.  Incidental finding of bilateral cerumen impaction.  After the disimpaction procedure, both tympanic membranes and middle ear spaces are noted to be normal.  Plan: 1.  Otomicroscopy with bilateral cerumen disimpaction. 2.  The physical exam findings are reviewed with the patient. 3.  Continue with Nasonex  and Allegra daily. 4.  Nasal saline irrigation is encouraged. 5.  The patient will return for reevaluation in 6 months, sooner if needed.

## 2024-05-06 ENCOUNTER — Ambulatory Visit: Admitting: Adult Health

## 2024-05-06 ENCOUNTER — Encounter: Payer: Self-pay | Admitting: Adult Health

## 2024-05-06 ENCOUNTER — Ambulatory Visit: Payer: Self-pay | Admitting: Internal Medicine

## 2024-05-06 VITALS — BP 124/76 | HR 74 | Temp 98.2°F | Ht 63.0 in | Wt 153.6 lb

## 2024-05-06 DIAGNOSIS — J45909 Unspecified asthma, uncomplicated: Secondary | ICD-10-CM

## 2024-05-06 DIAGNOSIS — J018 Other acute sinusitis: Secondary | ICD-10-CM | POA: Diagnosis not present

## 2024-05-06 MED ORDER — DOXYCYCLINE HYCLATE 100 MG PO TABS: 100.0000 mg | ORAL_TABLET | Freq: Two times a day (BID) | ORAL | 0 refills | Status: AC

## 2024-05-06 MED ORDER — PREDNISONE 10 MG PO TABS: ORAL_TABLET | ORAL | 0 refills | Status: AC

## 2024-05-06 NOTE — Telephone Encounter (Signed)
 FYI Only or Action Required?: FYI only for provider. Scheduled for an acute appointment today at 11 AM with ONEIDA Stank NP  Patient is followed in Pulmonology for asthma, last seen on 03/10/2024 by Geronimo Amel, MD.  Called Nurse Triage reporting Cough.  Symptoms began several days ago.  Interventions attempted: Rescue inhaler, Maintenance inhaler, and Increased fluids/rest.  Symptoms are: unchanged.  Triage Disposition: See HCP Within 4 Hours (Or PCP Triage)  Patient/caregiver understands and will follow disposition?: Yes  Copied from CRM [id number]. Topic: Clinical - Red Word Triage >> May 06, 2024  8:36 AM Nathanel DEL wrote: Red Word that prompted transfer to Nurse Triage: pt is having some breathing issues. Reason for Disposition  Wheezing is present  Answer Assessment - Initial Assessment Questions 1. ONSET: When did the cough begin?      Cough has been going on for a month 2. SEVERITY: How bad is the cough today?      Moderate but cough does fluctuates. 3. SPUTUM: Describe the color of your sputum (e.g., none, dry cough; clear, white, yellow, green)     Clear but has had some yellow at times 4. HEMOPTYSIS: Are you coughing up any blood? If Yes, ask: How much? (e.g., flecks, streaks, tablespoons, etc.)     no 5. DIFFICULTY BREATHING: Are you having difficulty breathing? If Yes, ask: How bad is it? (e.g., mild, moderate, severe)      no 6. FEVER: Do you have a fever? If Yes, ask: What is your temperature, how was it measured, and when did it start?     no 7. CARDIAC HISTORY: Do you have any history of heart disease? (e.g., heart attack, congestive heart failure)      no 8. LUNG HISTORY: Do you have any history of lung disease?  (e.g., pulmonary embolus, asthma, emphysema)     asthma 9. PE RISK FACTORS: Do you have a history of blood clots? (or: recent major surgery, recent prolonged travel, bedridden)     no 10. OTHER SYMPTOMS: Do you have any other  symptoms? (e.g., runny nose, wheezing, chest pain)       Fatigue with ambulation, wheezing 12. TRAVEL: Have you traveled out of the country in the last month? (e.g., travel history, exposures)       No  Reports she has been using her Bero inhaler without any issue. Patient reports she has been using her albuterol  inhaler which she said she hadn't had to use it in a long period of time. Patient is concerned with the fatigue she is having after moving a small amount of steps. Patient doesn't check her oxygen saturation levels at home. Patient is concerned with her symptoms.  Protocols used: Cough - Acute Productive-A-AH

## 2024-05-06 NOTE — Patient Instructions (Addendum)
 Doxycycline  100mg  Twice daily  for 1 week (Antibiotics)  Prednisone  taper over next week. (Steroid-to have on hold if not improving with wheezing)  Continue on Allegra 180mg  daily  Continue on Nasonex  daily  Try Liquid Mucinex DM Twice daily  As needed  cough Saline nasal rinses As needed   Continue Breo 1 puff daily, rinse after use  Albuterol  inhaler As needed   Follow up Dr. Geronimo as planned and As needed   Please contact office for sooner follow up if symptoms do not improve or worsen or seek emergency care

## 2024-05-06 NOTE — Progress Notes (Signed)
 @Patient  ID: Catherine Chambers, female    DOB: 08/10/1946, 78 y.o.   MRN: [id number]  Chief Complaint  Patient presents with   Asthma   Acute Visit    Fluid build up in right ear.  Cannot hear out of right ear.  3 weeks of symptoms.  Worse past 5 days.  Not resting well.  Has a lot of mucous, mostly clear with some yellow streaks.  Sob with talking and exertion.    Referring provider: Windy Coy, MD  HPI: 78 yo female followed for Asthma, Nasal polyps and Allergic rhinitis   TEST/EVENTS :   05/06/2024 Acute OV : Asthma  Discussed the use of AI scribe software for clinical note transcription with the patient, who gave verbal consent to proceed.  History of Present Illness Catherine Chambers is a 78 year old female with asthma and allergies who presents for an acute office visit with sinus congestion and wheezing and cough.   She has been experiencing sinus congestion intermittently for three weeks, with symptoms worsening over the last few days. There is significant mucus production, ear fullness, and a headache. She is currently unable to hear out of one ear due to being 'totally clogged'.  She has a history of asthma and allergies, managed with a Breo inhaler once daily, Allegra, and Nasonex . Her nasal polyps have shown signs of recurrence, prompting her to resume her nasal spray regimen in February. She is allergic to penicillin and cephalosporins.  She experiences wheezing, particularly when lying down, and used her albuterol  inhaler for the first time in seven years last night, but it did not provide relief. She describes a cycle of mucus production leading to coughing and wheezing, which disrupts her sleep. She attempted to sleep at an angle last night to alleviate symptoms.  She is very active, spending a lot of time gardening outdoors, and uses a neti pot daily to rinse her sinuses, especially after being outside. She drinks warm water and takes showers to help with  her symptoms. She has a history of earwax buildup and had her ear canals cleaned on August 22.  She drinks primarily water and reports no fever, vomiting, or coughing up blood.    Allergies  Allergen Reactions   Amoxicillin Hives, Shortness Of Breath and Itching   Erythromycin Hives, Shortness Of Breath and Swelling   Latex Rash and Other (See Comments)    Skin gets irritated and itchy   Sulfa Antibiotics Hives, Shortness Of Breath and Swelling   Almond (Diagnostic) Itching and Swelling   Keflex [Cephalexin] Nausea And Vomiting    severe   Penicillins Hives    Childhood reaction     Immunization History  Administered Date(s) Administered   Fluad Quad(high Dose 65+) 04/09/2019, 06/06/2022   INFLUENZA, HIGH DOSE SEASONAL PF 04/19/2017, 04/15/2018, 04/20/2019   Influenza Split 07/30/2012, 06/19/2014   Influenza,inj,Quad PF,6+ Mos 06/07/2013, 06/01/2015, 04/23/2016   Influenza-Unspecified 05/19/2002, 08/01/2003, 03/27/2019, 06/05/2020   PFIZER(Purple Top)SARS-COV-2 Vaccination 11/02/2019, 11/23/2019, 06/11/2020   Pfizer Covid-19 Vaccine Bivalent Booster 28yrs & up 05/24/2021   Pneumococcal Conjugate-13 01/12/2015   Pneumococcal Polysaccharide-23 01/25/2016   Td 08/20/2010, 10/05/2018   Tdap 01/12/2015, 09/20/2018   Unspecified SARS-COV-2 Vaccination 11/02/2019, 11/23/2019, 06/11/2020, 11/17/2020   Zoster, Live 02/21/2015, 08/04/2020    Past Medical History:  Diagnosis Date   Anemia    Anxiety    Chronic allergic conjunctivitis    DDD (degenerative disc disease), lumbar    Depression    History of cervical  polypectomy    ENDOCERVICAL   History of concussion    1985-- no residual   History of uterine fibroid    Moderate persistent asthma without complication    PULMOLOGIST-  DR RAMASWAMY   Multiple chemical sensitivity syndrome    Perennial allergic rhinitis with seasonal variation    PMB (postmenopausal bleeding)    Submucous uterine fibroid     Tobacco  History: Social History   Tobacco Use  Smoking Status Never  Smokeless Tobacco Never   Counseling given: Not Answered   Outpatient Medications Prior to Visit  Medication Sig Dispense Refill   albuterol  (PROVENTIL  HFA) 108 (90 Base) MCG/ACT inhaler Inhale 1-2 puffs into the lungs every 6 (six) hours as needed for wheezing or shortness of breath. 18 g 3   CALCIUM CITRATE PO Take 500 mg by mouth daily.     Cholecalciferol (VITAMIN D3) 1000 UNITS CAPS Take 1 capsule by mouth daily.     COVID-19 mRNA bivalent vaccine, Pfizer, (PFIZER COVID-19 VAC BIVALENT) injection Inject into the muscle. 0.3 mL 0   Fexofenadine HCl (ALLEGRA PO) Take by mouth.     fluticasone  furoate-vilanterol (BREO ELLIPTA ) 100-25 MCG/ACT AEPB Inhale 1 puff into the lungs daily. 60 each 11   ibuprofen  (ADVIL ) 200 MG tablet Take 200 mg by mouth every 6 (six) hours as needed for mild pain (pain score 1-3).     influenza vaccine adjuvanted (FLUAD) 0.5 ML injection Inject into the muscle. 0.5 mL 0   mometasone  (NASONEX  24HR) 50 MCG/ACT nasal spray Place 2 sprays into the nose daily. 1 each 12   NON FORMULARY Netipot     No facility-administered medications prior to visit.     Review of Systems:   Constitutional:   No  weight loss, night sweats,  Fevers, chills, fatigue, or  lassitude.  HEENT:   No headaches,  Difficulty swallowing,  Tooth/dental problems, or  Sore throat,                No sneezing, itching, ear ache, +nasal congestion, post nasal drip,   CV:  No chest pain,  Orthopnea, PND, swelling in lower extremities, anasarca, dizziness, palpitations, syncope.   GI  No heartburn, indigestion, abdominal pain, nausea, vomiting, diarrhea, change in bowel habits, loss of appetite, bloody stools.   Resp: .  No chest wall deformity  Skin: no rash or lesions.  GU: no dysuria, change in color of urine, no urgency or frequency.  No flank pain, no hematuria   MS:  No joint pain or swelling.  No decreased range of  motion.  No back pain.    Physical Exam  BP 124/76 (BP Location: Left Arm, Patient Position: Sitting, Cuff Size: Normal)   Pulse 74   Temp 98.2 F (36.8 C) (Oral)   Ht 5' 3 (1.6 m)   Wt 153 lb 9.6 oz (69.7 kg)   SpO2 98% Comment: RA  BMI 27.21 kg/m   GEN: A/Ox3; pleasant , NAD, well nourished    HEENT:  [place]/AT,  EACs-clear, TMs-wnl,  THROAT-clear, no lesions, no postnasal drip or exudate noted.   NECK:  Supple w/ fair ROM; no JVD; normal carotid impulses w/o bruits; no thyromegaly or nodules palpated; no lymphadenopathy.    RESP  Clear  P & A; w/o, wheezes/ rales/ or rhonchi. no accessory muscle use, no dullness to percussion  CARD:  RRR, no m/r/g, no peripheral edema, pulses intact, no cyanosis or clubbing.  GI:   Soft & nt; nml bowel  sounds; no organomegaly or masses detected.   Musco: Warm bil, no deformities or joint swelling noted.   Neuro: alert, no focal deficits noted.    Skin: Warm, no lesions or rashes    Lab Results:  CBC    Component Value Date/Time   WBC 6.2 03/23/2015 1430   RBC 4.67 03/23/2015 1430   HGB 12.8 03/23/2015 1430   HCT 39.9 03/23/2015 1430   PLT 299 03/23/2015 1430   MCV 85.4 03/23/2015 1430   MCH 27.4 03/23/2015 1430   MCHC 32.1 03/23/2015 1430   RDW 14.2 03/23/2015 1430   LYMPHSABS 1.6 01/03/2011 1557   MONOABS 0.5 01/03/2011 1557   EOSABS 0.2 01/03/2011 1557   BASOSABS 0.0 01/03/2011 1557    BMET    Component Value Date/Time   NA 136 01/03/2011 1557   K 4.2 01/03/2011 1557   CL 102 01/03/2011 1557   CO2 25 01/03/2011 1557   GLUCOSE 97 01/03/2011 1557   BUN 17 01/03/2011 1557   CREATININE 1.36 (H) 01/03/2011 1557   CALCIUM 8.9 01/03/2011 1557   GFRNONAA 39 (L) 01/03/2011 1557   GFRAA (L) 01/03/2011 1557    48        The eGFR has been calculated using the MDRD equation. This calculation has not been validated in all clinical situations. eGFR's persistently <60 mL/min signify possible Chronic Kidney Disease.     BNP No results found for: BNP  ProBNP No results found for: PROBNP  Imaging: No results found.  Administration History     None           No data to display          Lab Results  Component Value Date   NITRICOXIDE 31 05/15/2018        Assessment & Plan:   No problem-specific Assessment & Plan notes found for this encounter.   Assessment and Plan Assessment & Plan Acute URI/sinusitis exacerbated by sinus congestion, mucus production, ear fullness, and sinus pain and pressure, worsening over the past three weeks. . Doxycycline  is prescribed due to multiple antibiotic allergies and its effectiveness for sinus infections, with a discussion on potential sun sensitivity. Prescribe doxycycline  100 mg twice daily for 7 days with food. Advise saline nasal rinses to clear mucus and recommend Mucinex DM as needed to thin mucus and suppress cough. Continue Allegra and Nasonex  as previously prescribed. Advise sunscreen or protective clothing if exposed to sunlight.  Asthma with acute asthmatic bronchitic exacerbation   Her asthma exacerbation is likely secondary to the sinus infection and increased mucus production, causing wheezing and difficulty sleeping. Albuterol  inhaler use provided no relief. Consider viral illness or allergies as contributing factors. Prescribe prednisone  to reduce airway inflammation, with the option to delay use if symptoms improve with other treatments. Continue Breo inhaler as prescribed and advise against further use of albuterol  if ineffective.  Asthma action plan discussed in detail  Allergic rhinitis   Allergic rhinitis is managed with Allegra and Nasonex , with symptoms of sinus congestion and mucus production exacerbated by outdoor exposure from gardening. Continue Allegra and Nasonex  as previously prescribed and advise continued use of saline nasal rinses after outdoor exposure.  Multiple antibiotic drug allergies   Multiple antibiotic  allergies limit treatment options. Doxycycline  is chosen as it is not on her allergy list and is effective for sinus infections. Discuss potential sun sensitivity with doxycycline  use. Prescribe doxycycline  100 mg twice daily for 7 days with food and advise use of sunscreen or  protective clothing if exposed to sunlight.    Madelin Stank, NP 05/06/2024

## 2024-05-06 NOTE — Telephone Encounter (Signed)
 Has appt today,will be addressed

## 2024-05-21 ENCOUNTER — Telehealth (INDEPENDENT_AMBULATORY_CARE_PROVIDER_SITE_OTHER): Payer: Self-pay

## 2024-05-21 ENCOUNTER — Telehealth (INDEPENDENT_AMBULATORY_CARE_PROVIDER_SITE_OTHER): Payer: Self-pay | Admitting: Otolaryngology

## 2024-05-21 NOTE — Telephone Encounter (Signed)
 Returned patient 's phone call. Left her a voice mail.

## 2024-05-21 NOTE — Telephone Encounter (Signed)
 Patient called regarding a possible ear infection. Patient was requesting a call back to see if she needs to make an appointment.

## 2024-06-01 ENCOUNTER — Ambulatory Visit (INDEPENDENT_AMBULATORY_CARE_PROVIDER_SITE_OTHER): Admitting: Otolaryngology

## 2024-06-01 ENCOUNTER — Encounter (INDEPENDENT_AMBULATORY_CARE_PROVIDER_SITE_OTHER): Payer: Self-pay | Admitting: Otolaryngology

## 2024-06-01 VITALS — BP 126/76 | HR 66 | Temp 97.8°F | Ht 63.5 in | Wt 148.0 lb

## 2024-06-01 DIAGNOSIS — J338 Other polyp of sinus: Secondary | ICD-10-CM

## 2024-06-01 DIAGNOSIS — J324 Chronic pansinusitis: Secondary | ICD-10-CM | POA: Diagnosis not present

## 2024-06-01 DIAGNOSIS — H6521 Chronic serous otitis media, right ear: Secondary | ICD-10-CM | POA: Insufficient documentation

## 2024-06-01 DIAGNOSIS — H6983 Other specified disorders of Eustachian tube, bilateral: Secondary | ICD-10-CM

## 2024-06-01 MED ORDER — PREDNISONE 10 MG (21) PO TBPK: ORAL_TABLET | ORAL | 0 refills | Status: AC

## 2024-06-01 NOTE — Progress Notes (Signed)
 Patient ID: Catherine Chambers, female   DOB: 1946/07/31, 78 y.o.   MRN: [id number]    Discussed the use of AI scribe software for clinical note transcription with the patient, who gave verbal consent to proceed.  History of Present Illness Catherine Chambers is a 78 year old female with chronic rhinosinusitis and polyposis who presents today with recurrent sinusitis, ear congestion, and hearing loss.  She has a history of chronic rhinosinusitis and polyposis, having undergone bilateral endoscopic sinus surgery in the 1980s. She was treated with Nasonex  and Allegra, and nasal saline irrigation was encouraged.  Since her last visit, she has experienced a significant increase in mucus production, coughing, and ear involvement, particularly in dusty environments where she feels nasal swelling. After her last appointment, her symptoms improved temporarily but returned within a week and a half, with excessive mucus and post-nasal drainage affecting her sleep.  She consulted her pulmonologist, who suggested a sinus infection, and she completed a seven-day course of doxycycline . This treatment led to some improvement, but she continues to experience drainage, especially at night, and has to clear her throat frequently upon waking.  She reports significant hearing loss, estimating her hearing at 25% in one ear, and describes a sensation of pressure in her head. She also experiences slight vertigo, requiring her to rise slowly to avoid dizziness.  She continues to use a steroid nasal spray, two puffs in each nostril every morning.  Exam: General: Communicates without difficulty, well nourished, no acute distress. Head: Normocephalic, no evidence injury, no tenderness, facial buttresses intact without stepoff. Face/sinus: No tenderness to palpation and percussion. Facial movement is normal and symmetric. Eyes: PERRL, EOMI. No scleral icterus, conjunctivae clear. Neuro: CN II exam reveals vision  grossly intact.  No nystagmus at any point of gaze. Ears: Auricles well formed without lesions.  Ear canals are intact without mass or lesion.  No erythema or edema is appreciated.  The TMs are intact with right middle ear effusion. Nose: External evaluation reveals normal support and skin without lesions.  Dorsum is intact.  Anterior rhinoscopy reveals congested mucosa over anterior aspect of inferior turbinates and intact septum.  Oral:  Oral cavity and oropharynx are intact, symmetric, without erythema or edema.  Mucosa is moist without lesions. Neck: Full range of motion without pain.  There is no significant lymphadenopathy.  No masses palpable.  Thyroid bed within normal limits to palpation.  Parotid glands and submandibular glands equal bilaterally without mass.  Trachea is midline. Neuro:  CN 2-12 grossly intact.   Assessment and Plan Assessment & Plan Chronic rhinosinusitis with nasal polyps Persistent symptoms of excessive mucus, post-nasal drainage, and coughing suggest possible sinus infection. Doxycycline  provided some improvement, but symptoms persist, indicating incomplete resolution of infection or ongoing inflammation. - Order CT scan of sinuses to assess the extent of her chronic rhinosinusitis. - Prescribe prednisone  with a tapering dose of 6, 5, 4, 3, 2, 1 tablets - Continue Nasonex  nasal spray, two puffs in each nostril daily  Eustachian tube dysfunction with middle ear effusion and hearing loss Eustachian tube dysfunction with fluid build-up suspected due to inflammation from chronic rhinosinusitis. Hearing loss reported, with significant hearing reduction in right ear. Possible fluid in the middle ear contributing to hearing loss - Order CT scan to evaluate for fluid build-up - Start prednisone  to reduce inflammation and potentially improve eustachian tube function  Allergic rhinitis Allergic rhinitis contributing to sinonasal symptoms and possibly exacerbating eustachian tube  dysfunction. - Continue Nasonex  nasal  spray, two puffs in each nostril daily

## 2024-06-03 ENCOUNTER — Ambulatory Visit (HOSPITAL_BASED_OUTPATIENT_CLINIC_OR_DEPARTMENT_OTHER)
Admission: RE | Admit: 2024-06-03 | Discharge: 2024-06-03 | Disposition: A | Discharge status: Home / Self Care | Source: Ambulatory Visit | Attending: Otolaryngology | Admitting: Otolaryngology

## 2024-06-03 DIAGNOSIS — J324 Chronic pansinusitis: Secondary | ICD-10-CM | POA: Insufficient documentation

## 2024-06-16 ENCOUNTER — Ambulatory Visit (INDEPENDENT_AMBULATORY_CARE_PROVIDER_SITE_OTHER): Admitting: Otolaryngology

## 2024-06-16 ENCOUNTER — Encounter (INDEPENDENT_AMBULATORY_CARE_PROVIDER_SITE_OTHER): Payer: Self-pay | Admitting: Otolaryngology

## 2024-06-16 VITALS — BP 138/69 | HR 70 | Temp 97.9°F | Ht 63.0 in | Wt 147.0 lb

## 2024-06-16 DIAGNOSIS — J338 Other polyp of sinus: Secondary | ICD-10-CM | POA: Diagnosis not present

## 2024-06-16 DIAGNOSIS — J324 Chronic pansinusitis: Secondary | ICD-10-CM | POA: Diagnosis not present

## 2024-06-16 DIAGNOSIS — J343 Hypertrophy of nasal turbinates: Secondary | ICD-10-CM

## 2024-06-16 DIAGNOSIS — H6983 Other specified disorders of Eustachian tube, bilateral: Secondary | ICD-10-CM

## 2024-06-17 NOTE — Progress Notes (Signed)
 Patient ID: Catherine Chambers, female   DOB: 1945-11-29, 78 y.o.   MRN: [id number]  Follow-up: Chronic rhinosinusitis, sinonasal polyps  HPI: The patient is a 78 year old female who returns today for her follow-up evaluation.  The patient was previously seen for her chronic rhinosinusitis and sinonasal polyps.  She underwent bilateral endoscopic sinus surgery in the 1980s.  At her last visit, she was noted to have progressive worsening of her sinonasal polyps, with chronic rhinosinusitis and eustachian tube dysfunction.  She was treated with steroid nasal spray and systemic prednisone .  She also underwent a sinus CT scan.  The CT showed bilateral pansinusitis, with a large amount of polypoid tissue obstructing her nasal cavities and all paranasal sinuses.  The patient returns today complaining of persistent nasal obstruction, facial pain and pressure, and nasal drainage.  Exam: General: Communicates without difficulty, well nourished, no acute distress. Head: Normocephalic, no evidence injury, no tenderness, facial buttresses intact without stepoff. Face/sinus: No tenderness to palpation and percussion. Facial movement is normal and symmetric. Eyes: PERRL, EOMI. No scleral icterus, conjunctivae clear. Neuro: CN II exam reveals vision grossly intact.  No nystagmus at any point of gaze. Ears: Auricles well formed without lesions.  Ear canals are intact without mass or lesion.  No erythema or edema is appreciated.  The TMs are intact without fluid. Nose: External evaluation reveals normal support and skin without lesions.  Dorsum is intact.  Anterior rhinoscopy reveals congested mucosa over anterior aspect of inferior turbinates and intact septum.  Nasal polyposis is noted bilaterally.  Oral:  Oral cavity and oropharynx are intact, symmetric, without erythema or edema.  Mucosa is moist without lesions. Neck: Full range of motion without pain.  There is no significant lymphadenopathy.  No masses palpable.   Thyroid bed within normal limits to palpation.  Parotid glands and submandibular glands equal bilaterally without mass.  Trachea is midline. Neuro:  CN 2-12 grossly intact.   Assessment: 1.  Bilateral chronic pansinusitis with extensive sinonasal polyps involving all paranasal sinuses. 2.  Bilateral inferior turbinate hypertrophy. 3.  Bilateral eustachian tube dysfunction.  Plan: 1.  The physical exam findings and CT images are extensively reviewed with the patient. 2.  Continue with nasal Nasonex  nasal sprays daily. 3.  Nasal saline irrigation daily. 4.  The treatment options are discussed.  Options include surgical intervention with bilateral revision sinus surgery versus medical treatment with Dupixent injection. 5.  The patient would like to consider her options.  Allergy referral to discuss the possible use of Dupixent. 6.  The patient will return for reevaluation in 2 to 3 months.

## 2024-06-28 ENCOUNTER — Telehealth (INDEPENDENT_AMBULATORY_CARE_PROVIDER_SITE_OTHER): Payer: Self-pay

## 2024-06-28 ENCOUNTER — Telehealth (INDEPENDENT_AMBULATORY_CARE_PROVIDER_SITE_OTHER): Payer: Self-pay | Admitting: Otolaryngology

## 2024-06-28 NOTE — Telephone Encounter (Signed)
 Patient LVM requesting a call back.

## 2024-06-28 NOTE — Telephone Encounter (Signed)
 Returned patient 's call. Left her a voice mail.

## 2024-06-28 NOTE — Telephone Encounter (Signed)
 Patient Catherine Chambers regarding medication that was prescribed. She says that it is giving her unpleasant side effects and would like an alternative if possible. Patient has not took medication for past three days. Please advise.

## 2024-06-29 ENCOUNTER — Telehealth (INDEPENDENT_AMBULATORY_CARE_PROVIDER_SITE_OTHER): Payer: Self-pay | Admitting: Otolaryngology

## 2024-06-29 ENCOUNTER — Ambulatory Visit (INDEPENDENT_AMBULATORY_CARE_PROVIDER_SITE_OTHER): Admitting: Otolaryngology

## 2024-06-29 NOTE — Telephone Encounter (Signed)
 Patient called and stated that the Nasonex  is causing her sores in her nose with nosebleed. She has stopped using her Nasonex  3 days ago. Dr. Karis is aware of this. Prednisone  10 mg  dosepack for 10 days was called in at her local pharmacy. She stated that she would hold off the Prednisone  till see the allergies on Friday 07/02/24.

## 2024-08-27 ENCOUNTER — Other Ambulatory Visit: Payer: Self-pay | Admitting: Family Medicine

## 2024-08-27 DIAGNOSIS — Z1231 Encounter for screening mammogram for malignant neoplasm of breast: Secondary | ICD-10-CM

## 2024-10-15 ENCOUNTER — Ambulatory Visit (INDEPENDENT_AMBULATORY_CARE_PROVIDER_SITE_OTHER): Admitting: Otolaryngology

## 2025-02-14 ENCOUNTER — Ambulatory Visit
